# Patient Record
Sex: Female | Born: 1960
Health system: Southern US, Community
[De-identification: ages and names within clinical notes are randomized; demographics above are authoritative.]

## PROBLEM LIST (undated history)

## (undated) DIAGNOSIS — Z86010 Personal history of colonic polyps: Secondary | ICD-10-CM

## (undated) DIAGNOSIS — M19041 Primary osteoarthritis, right hand: Secondary | ICD-10-CM

## (undated) DIAGNOSIS — R112 Nausea with vomiting, unspecified: Secondary | ICD-10-CM

## (undated) DIAGNOSIS — H269 Unspecified cataract: Secondary | ICD-10-CM

## (undated) DIAGNOSIS — J45909 Unspecified asthma, uncomplicated: Secondary | ICD-10-CM

## (undated) DIAGNOSIS — T7840XA Allergy, unspecified, initial encounter: Secondary | ICD-10-CM

## (undated) DIAGNOSIS — G5603 Carpal tunnel syndrome, bilateral upper limbs: Secondary | ICD-10-CM

## (undated) DIAGNOSIS — Z9889 Other specified postprocedural states: Secondary | ICD-10-CM

## (undated) DIAGNOSIS — J302 Other seasonal allergic rhinitis: Secondary | ICD-10-CM

## (undated) DIAGNOSIS — M19042 Primary osteoarthritis, left hand: Secondary | ICD-10-CM

## (undated) HISTORY — DX: Unspecified asthma, uncomplicated: J45.909

## (undated) HISTORY — DX: Allergy, unspecified, initial encounter: T78.40XA

## (undated) HISTORY — PX: BREAST EXCISIONAL BIOPSY: SUR124

## (undated) HISTORY — DX: Personal history of colonic polyps: Z86.010

## (undated) HISTORY — DX: Unspecified cataract: H26.9

## (undated) HISTORY — DX: Other seasonal allergic rhinitis: J30.2

## (undated) HISTORY — PX: OTHER SURGICAL HISTORY: SHX169

## (undated) HISTORY — PX: CATARACT EXTRACTION: SUR2

## (undated) HISTORY — DX: Primary osteoarthritis, right hand: M19.041

## (undated) HISTORY — DX: Primary osteoarthritis, right hand: M19.042

## (undated) HISTORY — PX: WISDOM TOOTH EXTRACTION: SHX21

## (undated) HISTORY — DX: Carpal tunnel syndrome, bilateral upper limbs: G56.03

## (undated) HISTORY — PX: CHOLECYSTECTOMY: SHX55

---

## 1999-02-06 ENCOUNTER — Ambulatory Visit (HOSPITAL_BASED_OUTPATIENT_CLINIC_OR_DEPARTMENT_OTHER): Admission: RE | Admit: 1999-02-06 | Discharge: 1999-02-06 | Payer: Self-pay | Admitting: Orthopedic Surgery

## 1999-06-12 ENCOUNTER — Other Ambulatory Visit: Admission: RE | Admit: 1999-06-12 | Discharge: 1999-06-12 | Payer: Self-pay | Admitting: Gynecology

## 2000-06-20 ENCOUNTER — Other Ambulatory Visit: Admission: RE | Admit: 2000-06-20 | Discharge: 2000-06-20 | Payer: Self-pay | Admitting: Gynecology

## 2001-07-28 ENCOUNTER — Other Ambulatory Visit: Admission: RE | Admit: 2001-07-28 | Discharge: 2001-07-28 | Payer: Self-pay | Admitting: Gynecology

## 2001-07-31 ENCOUNTER — Encounter: Admission: RE | Admit: 2001-07-31 | Discharge: 2001-09-05 | Payer: Self-pay | Admitting: Family Medicine

## 2001-12-25 ENCOUNTER — Encounter: Payer: Self-pay | Admitting: Family Medicine

## 2001-12-25 ENCOUNTER — Encounter: Admission: RE | Admit: 2001-12-25 | Discharge: 2001-12-25 | Payer: Self-pay | Admitting: Family Medicine

## 2002-05-25 ENCOUNTER — Encounter (INDEPENDENT_AMBULATORY_CARE_PROVIDER_SITE_OTHER): Payer: Self-pay | Admitting: Specialist

## 2002-05-25 ENCOUNTER — Observation Stay (HOSPITAL_COMMUNITY): Admission: RE | Admit: 2002-05-25 | Discharge: 2002-05-26 | Payer: Self-pay | Admitting: Surgery

## 2002-08-10 ENCOUNTER — Other Ambulatory Visit: Admission: RE | Admit: 2002-08-10 | Discharge: 2002-08-10 | Payer: Self-pay | Admitting: Gynecology

## 2003-08-06 ENCOUNTER — Encounter: Admission: RE | Admit: 2003-08-06 | Discharge: 2003-08-06 | Payer: Self-pay | Admitting: Gynecology

## 2003-08-10 ENCOUNTER — Other Ambulatory Visit: Admission: RE | Admit: 2003-08-10 | Discharge: 2003-08-10 | Payer: Self-pay | Admitting: Gynecology

## 2004-08-10 ENCOUNTER — Other Ambulatory Visit: Admission: RE | Admit: 2004-08-10 | Discharge: 2004-08-10 | Payer: Self-pay | Admitting: Gynecology

## 2004-09-05 ENCOUNTER — Encounter: Admission: RE | Admit: 2004-09-05 | Discharge: 2004-09-05 | Payer: Self-pay | Admitting: Gynecology

## 2004-09-29 ENCOUNTER — Ambulatory Visit (HOSPITAL_COMMUNITY): Admission: RE | Admit: 2004-09-29 | Discharge: 2004-09-29 | Payer: Self-pay | Admitting: Gynecology

## 2005-06-22 ENCOUNTER — Ambulatory Visit: Payer: Self-pay | Admitting: Internal Medicine

## 2005-08-03 ENCOUNTER — Ambulatory Visit: Payer: Self-pay | Admitting: Internal Medicine

## 2005-08-14 ENCOUNTER — Other Ambulatory Visit: Admission: RE | Admit: 2005-08-14 | Discharge: 2005-08-14 | Payer: Self-pay | Admitting: Gynecology

## 2005-09-06 ENCOUNTER — Encounter: Admission: RE | Admit: 2005-09-06 | Discharge: 2005-09-06 | Payer: Self-pay | Admitting: Gynecology

## 2005-10-05 ENCOUNTER — Ambulatory Visit: Payer: Self-pay | Admitting: Internal Medicine

## 2005-10-24 ENCOUNTER — Ambulatory Visit: Payer: Self-pay | Admitting: Internal Medicine

## 2005-12-21 ENCOUNTER — Ambulatory Visit: Payer: Self-pay | Admitting: Internal Medicine

## 2006-02-21 ENCOUNTER — Other Ambulatory Visit: Admission: RE | Admit: 2006-02-21 | Discharge: 2006-02-21 | Payer: Self-pay | Admitting: Gynecology

## 2006-02-28 ENCOUNTER — Ambulatory Visit: Payer: Self-pay | Admitting: Internal Medicine

## 2006-05-23 ENCOUNTER — Ambulatory Visit: Payer: Self-pay | Admitting: Internal Medicine

## 2006-05-29 ENCOUNTER — Ambulatory Visit: Payer: Self-pay | Admitting: Internal Medicine

## 2006-08-20 ENCOUNTER — Other Ambulatory Visit: Admission: RE | Admit: 2006-08-20 | Discharge: 2006-08-20 | Payer: Self-pay | Admitting: Gynecology

## 2006-09-10 ENCOUNTER — Encounter: Admission: RE | Admit: 2006-09-10 | Discharge: 2006-09-10 | Payer: Self-pay | Admitting: Gynecology

## 2006-10-28 ENCOUNTER — Ambulatory Visit: Payer: Self-pay | Admitting: Internal Medicine

## 2007-01-10 DIAGNOSIS — J9801 Acute bronchospasm: Secondary | ICD-10-CM | POA: Insufficient documentation

## 2007-01-10 DIAGNOSIS — N6009 Solitary cyst of unspecified breast: Secondary | ICD-10-CM | POA: Insufficient documentation

## 2007-02-28 ENCOUNTER — Ambulatory Visit: Payer: Self-pay | Admitting: Internal Medicine

## 2007-07-15 ENCOUNTER — Ambulatory Visit (HOSPITAL_COMMUNITY): Admission: RE | Admit: 2007-07-15 | Discharge: 2007-07-15 | Payer: Self-pay | Admitting: Gynecology

## 2007-09-04 ENCOUNTER — Other Ambulatory Visit: Admission: RE | Admit: 2007-09-04 | Discharge: 2007-09-04 | Payer: Self-pay | Admitting: Gynecology

## 2007-09-16 ENCOUNTER — Encounter: Admission: RE | Admit: 2007-09-16 | Discharge: 2007-09-16 | Payer: Self-pay | Admitting: Gynecology

## 2008-09-06 ENCOUNTER — Other Ambulatory Visit: Admission: RE | Admit: 2008-09-06 | Discharge: 2008-09-06 | Payer: Self-pay | Admitting: Gynecology

## 2008-10-13 ENCOUNTER — Encounter: Admission: RE | Admit: 2008-10-13 | Discharge: 2008-10-13 | Payer: Self-pay | Admitting: Gynecology

## 2009-03-02 ENCOUNTER — Ambulatory Visit: Admission: RE | Admit: 2009-03-02 | Discharge: 2009-03-02 | Payer: Self-pay | Admitting: Gynecologic Oncology

## 2009-08-05 ENCOUNTER — Ambulatory Visit: Payer: Self-pay | Admitting: Family

## 2009-08-05 DIAGNOSIS — J209 Acute bronchitis, unspecified: Secondary | ICD-10-CM | POA: Insufficient documentation

## 2009-08-05 DIAGNOSIS — M1990 Unspecified osteoarthritis, unspecified site: Secondary | ICD-10-CM | POA: Insufficient documentation

## 2009-09-07 ENCOUNTER — Ambulatory Visit: Payer: Self-pay | Admitting: Radiology

## 2009-09-07 ENCOUNTER — Emergency Department (HOSPITAL_BASED_OUTPATIENT_CLINIC_OR_DEPARTMENT_OTHER): Admission: EM | Admit: 2009-09-07 | Discharge: 2009-09-07 | Payer: Self-pay | Admitting: Emergency Medicine

## 2009-09-14 ENCOUNTER — Ambulatory Visit: Payer: Self-pay | Admitting: Family

## 2009-09-14 DIAGNOSIS — S20219A Contusion of unspecified front wall of thorax, initial encounter: Secondary | ICD-10-CM | POA: Insufficient documentation

## 2010-10-09 ENCOUNTER — Encounter: Payer: Self-pay | Admitting: Internal Medicine

## 2010-11-02 NOTE — Letter (Signed)
Summary: Colonoscopy Letter   Gastroenterology  [address]   [place], [place] [address]   Phone: [phone]  Fax: [phone]      October 09, 2010 MRN: [id number]   [facility] [address] [place], [place]  [address]   Dear Paula Leblanc,   According to your medical record, it is time for you to schedule a Colonoscopy. The American Cancer Society recommends this procedure as a method to detect early colon cancer. Patients with a family history of colon cancer, or a personal history of colon polyps or inflammatory bowel disease are at increased risk.  This letter has been generated based on the recommendations made at the time of your procedure. If you feel that in your particular situation this may no longer apply, please contact our office.  Please call our office at [phone] to schedule this appointment or to update your records at your earliest convenience.  Thank you for cooperating with [place] to provide you with the very best care possible.   Sincerely,   Stan Head, M.D.  [facility] Gastroenterology Division [phone]

## 2010-11-23 ENCOUNTER — Encounter (INDEPENDENT_AMBULATORY_CARE_PROVIDER_SITE_OTHER): Payer: Self-pay | Admitting: *Deleted

## 2010-11-28 ENCOUNTER — Encounter (INDEPENDENT_AMBULATORY_CARE_PROVIDER_SITE_OTHER): Payer: Self-pay | Admitting: *Deleted

## 2010-11-28 NOTE — Letter (Signed)
Summary: Pre Visit Letter Revised  [facility]  [address] [place], [place] [address]   Phone: [phone]  Fax: [phone]        11/23/2010 MRN: [id number] Paula Leblanc [address] [place], [place]  [address]             Procedure Date:  12/07/2010 @ 8:30   Recall colon-Dr. Leone Payor   Welcome to the Gastroenterology Division at [facility].    You are scheduled to see a nurse for your pre-procedure visit on 11/29/2010 at 2:30 on the 3rd floor at [facility], [address]  We ask that you try to arrive at our office 15 minutes prior to your appointment time to allow for check-in.  Please take a minute to review the attached form.  If you answer "Yes" to one or more of the questions on the first page, we ask that you call the person listed at your earliest opportunity.  If you answer "No" to all of the questions, please complete the rest of the form and bring it to your appointment.    Your nurse visit will consist of discussing your medical and surgical history, your immediate family medical history, and your medications.   If you are unable to list all of your medications on the form, please bring the medication bottles to your appointment and we will list them.  We will need to be aware of both prescribed and over the counter drugs.  We will need to know exact dosage information as well.    Please be prepared to read and sign documents such as consent forms, a financial agreement, and acknowledgement forms.  If necessary, and with your consent, a friend or relative is welcome to sit-in on the nurse visit with you.  Please bring your insurance card so that we may make a copy of it.  If your insurance requires a referral to see a specialist, please bring your referral form from your primary care physician.  No co-pay is required for this nurse visit.     If you cannot keep your appointment, please call [phone] to cancel or reschedule prior to your  appointment date.  This allows [place] the opportunity to schedule an appointment for another patient in need of care.    Thank you for choosing [facility] for your medical needs.  We appreciate the opportunity to care for you.  Please visit [place] at our website  to learn more about our practice.  Sincerely, The Gastroenterology Division

## 2010-11-29 ENCOUNTER — Encounter: Payer: Self-pay | Admitting: Internal Medicine

## 2010-12-07 ENCOUNTER — Other Ambulatory Visit (AMBULATORY_SURGERY_CENTER): Payer: 59 | Admitting: Internal Medicine

## 2010-12-07 ENCOUNTER — Encounter: Payer: Self-pay | Admitting: Internal Medicine

## 2010-12-07 DIAGNOSIS — K5730 Diverticulosis of large intestine without perforation or abscess without bleeding: Secondary | ICD-10-CM

## 2010-12-07 DIAGNOSIS — Z1211 Encounter for screening for malignant neoplasm of colon: Secondary | ICD-10-CM

## 2010-12-07 DIAGNOSIS — Z800 Family history of malignant neoplasm of digestive organs: Secondary | ICD-10-CM

## 2010-12-07 DIAGNOSIS — K648 Other hemorrhoids: Secondary | ICD-10-CM

## 2010-12-07 LAB — HM COLONOSCOPY

## 2010-12-07 NOTE — Miscellaneous (Signed)
Summary: [facility] Previsit/prep  Clinical Lists Changes  Medications: Added new medication of MOVIPREP 100 GM  SOLR (PEG-KCL-NACL-NASULF-NA ASC-C) As per prep instructions. - Signed Rx of MOVIPREP 100 GM  SOLR (PEG-KCL-NACL-NASULF-NA ASC-C) As per prep instructions.;  #1 x 0;  Signed;  Entered by: Wyona Almas RN;  Authorized by: Iva Boop MD, [facility];  Method used: [facility] [phone]*, [address] [place], [place], [place]  [address], Ph: [phone], Fax: [phone] Allergies: Changed allergy or adverse reaction from ERYTHROMYCIN to ERYTHROMYCIN Observations: Added new observation of ALLERGY REV: Done (11/29/2010 14:01)    Prescriptions: MOVIPREP 100 GM  SOLR (PEG-KCL-NACL-NASULF-NA ASC-C) As per prep instructions.  #1 x 0   Entered by:   Wyona Almas RN   Authorized by:   Iva Boop MD, [facility]   Signed by:   Wyona Almas RN on 11/29/2010   Method used:   [facility]ronically to        [facility] [phone]* (retail)       [address]       [place], [place]  [address]       Ph: [phone]       Fax: [phone]   RxID:   [id number]

## 2010-12-07 NOTE — Letter (Signed)
Summary: [facility] Instructions  [facility]  [address] [place], [place] [address]   Phone: [phone]  Fax: [phone]       Paula Leblanc    02-07-1961    MRN: [id number]        Procedure Day Dorna Bloom:  Lenor Coffin  12/07/10     Arrival Time:  7:30AM     Procedure Time:  8:30AM     Location of Procedure:                    _ X_  [facility] (4th Floor)                      PREPARATION FOR COLONOSCOPY WITH MOVIPREP   Starting 5 days prior to your procedure 12/02/10 do not eat nuts, seeds, popcorn, corn, beans, peas,  salads, or any raw vegetables.  Do not take any fiber supplements (e.g. Metamucil, Citrucel, and Benefiber).  THE DAY BEFORE YOUR PROCEDURE         DATE: 12/06/10  DAY: WEDNESDAY  1.  Drink clear liquids the entire day-NO SOLID FOOD  2.  Do not drink anything colored red or purple.  Avoid juices with pulp.  No orange juice.  3.  Drink at least 64 oz. (8 glasses) of fluid/clear liquids during the day to prevent dehydration and help the prep work efficiently.  CLEAR LIQUIDS INCLUDE: Water Jello Ice Popsicles Tea (sugar ok, no milk/cream) Powdered fruit flavored drinks Coffee (sugar ok, no milk/cream) Gatorade Juice: apple, white grape, white cranberry  Lemonade Clear bullion, consomm, broth Carbonated beverages (any kind) Strained chicken noodle soup Hard Candy                             4.  In the morning, mix first dose of MoviPrep solution:    Empty 1 Pouch A and 1 Pouch B into the disposable container    Add lukewarm drinking water to the top line of the container. Mix to dissolve    Refrigerate (mixed solution should be used within 24 hrs)  5.  Begin drinking the prep at 5:00 p.m. The MoviPrep container is divided by 4 marks.   Every 15 minutes drink the solution down to the next mark (approximately 8 oz) until the full liter is complete.   6.  Follow completed prep with 16 oz of clear liquid of your choice (Nothing  red or purple).  Continue to drink clear liquids until bedtime.  7.  Before going to bed, mix second dose of MoviPrep solution:    Empty 1 Pouch A and 1 Pouch B into the disposable container    Add lukewarm drinking water to the top line of the container. Mix to dissolve    Refrigerate  THE DAY OF YOUR PROCEDURE      DATE: 12/07/10   DAY: THURSDAY  Beginning at 3:30AM (5 hours before procedure):         1. Every 15 minutes, drink the solution down to the next mark (approx 8 oz) until the full liter is complete.  2. Follow completed prep with 16 oz. of clear liquid of your choice.    3. You may drink clear liquids until 6:30AM (2 HOURS BEFORE PROCEDURE).   MEDICATION INSTRUCTIONS  Unless otherwise instructed, you should take regular prescription medications with a small sip of water   as early as possible the morning of  your procedure.        OTHER INSTRUCTIONS  You will need a responsible adult at least 52 years of age to accompany you and drive you home.   This person must remain in the waiting room during your procedure.  Wear loose fitting clothing that is easily removed.  Leave jewelry and other valuables at home.  However, you may wish to bring a book to read or  an iPod/MP3 player to listen to music as you wait for your procedure to start.  Remove all body piercing jewelry and leave at home.  Total time from sign-in until discharge is approximately 2-3 hours.  You should go home directly after your procedure and rest.  You can resume normal activities the  day after your procedure.  The day of your procedure you should not:   Drive   Make legal decisions   Operate machinery   Drink alcohol   Return to work  You will receive specific instructions about eating, activities and medications before you leave.    The above instructions have been reviewed and explained to me by  Wyona Almas RN  November 29, 2010 3:02 PM     I fully understand and can  verbalize these instructions _____________________________ Date _________

## 2010-12-12 NOTE — Procedures (Signed)
Summary: Colonoscopy  Patient: Paula Leblanc Note: All result statuses are Final unless otherwise noted.  Tests: (1) Colonoscopy (COL)   COL Colonoscopy           DONE     [facility]     [address]     [place], [place]  [address]          COLONOSCOPY PROCEDURE REPORT          PATIENT:  Paula, Leblanc  MR#:  [id number]     BIRTHDATE:  26-Jan-1961, 50 yrs. old  GENDER:  female     ENDOSCOPIST:  Iva Boop, MD, [facility]          PROCEDURE DATE:  12/07/2010     PROCEDURE:  Colonoscopy [address]     ASA CLASS:  Class I     INDICATIONS:  Elevated Risk Screening, family history of colon     cancer father in 33's     sister also with hx polyps     MEDICATIONS:   Fentanyl 50 mcg IV, Versed 8 mg IV          DESCRIPTION OF PROCEDURE:   After the risks benefits and     alternatives of the procedure were thoroughly explained, informed     consent was obtained.  Digital rectal exam was performed and     revealed no abnormalities.   The LB 180AL [id number] endoscope was     introduced through the anus and advanced to the cecum, which was     identified by both the appendix and ileocecal valve, without     limitations.  The quality of the prep was good, using MoviPrep.     The instrument was then slowly withdrawn as the colon was fully     examined. Insertion: 5:39 minutes Withdrawal: 16:20 minutes     <<PROCEDUREIMAGES>>          FINDINGS:  Moderate diverticulosis was found in the sigmoid colon.     This was otherwise a normal examination of the colon. Includes     right retroflexion.   Retroflexed views in the rectum revealed     internal hemorrhoids.    The scope was then withdrawn from the     patient and the procedure completed.          COMPLICATIONS:  None     ENDOSCOPIC IMPRESSION:     1) Moderate diverticulosis in the sigmoid colon     2) Internal hemorrhoids     3) Otherwise normal examination, good prep          REPEAT EXAM:  In 5 year(s) for routine screening  colonoscopy.     Family history of colon cancer in father.          Iva Boop, MD, [facility]          CC:  The Patient          n.     eSIGNED:   Iva Boop at 12/07/2010 09:12 AM          Lenise Arena, [id number]  Note: An exclamation mark (!) indicates a result that was not dispersed into the flowsheet. Document Creation Date: 12/07/2010 9:17 AM _______________________________________________________________________  (1) Order result status: Final Collection or observation date-time: 12/07/2010 09:03 Requested date-time:  Receipt date-time:  Reported date-time:  Referring Physician:   Ordering Physician: Stan Head [phone]) Specimen Source:  Source: Launa Grill Order Number: [id number] Lab site:   Appended  Document: Colonoscopy    Clinical Lists Changes  Observations: Added new observation of COLONNXTDUE: 11/2015 (12/07/2010 14:06)

## 2011-01-02 LAB — BASIC METABOLIC PANEL WITH GFR
Calcium: 9.2 mg/dL (ref 8.4–10.5)
GFR calc Af Amer: >60 mL/min (ref >60)
GFR calc non Af Amer: >60 mL/min (ref >60)
Glucose, Bld: 99 mg/dL (ref 70–99)
Potassium: 4.4 meq/L (ref 3.5–5.1)
Sodium: 147 meq/L — ABNORMAL HIGH (ref 135–145)

## 2011-01-02 LAB — PROTIME-INR
INR: 0.99 (ref 0.00–1.49)
Prothrombin Time: 13 seconds (ref 11.6–15.2)

## 2011-01-02 LAB — DIFFERENTIAL
Basophils Absolute: 0.1 K/uL (ref 0.0–0.1)
Basophils Relative: 1 % (ref 0–1)
Eosinophils Absolute: 0.2 K/uL (ref 0.0–0.7)
Eosinophils Relative: 2 % (ref 0–5)
Lymphocytes Relative: 24 % (ref 12–46)
Lymphs Abs: 2.1 10*3/uL (ref 0.7–4.0)
Monocytes Absolute: 0.5 K/uL (ref 0.1–1.0)
Monocytes Relative: 6 % (ref 3–12)
Neutro Abs: 6.2 K/uL (ref 1.7–7.7)
Neutrophils Relative %: 67 % (ref 43–77)

## 2011-01-02 LAB — CBC
HCT: 42 % (ref 36.0–46.0)
Hemoglobin: 14.1 g/dL (ref 12.0–15.0)
MCHC: 33.6 g/dL (ref 30.0–36.0)
MCV: 90.1 fL (ref 78.0–100.0)
Platelets: 259 10*3/uL (ref 150–400)
RBC: 4.66 MIL/uL (ref 3.87–5.11)
RDW: 12.3 % (ref 11.5–15.5)
WBC: 9.1 K/uL (ref 4.0–10.5)

## 2011-01-02 LAB — D-DIMER, QUANTITATIVE: D-Dimer, Quant: 0.3 ug{FEU}/mL (ref 0.00–0.48)

## 2011-01-02 LAB — BASIC METABOLIC PANEL
BUN: 14 mg/dL (ref 6–23)
CO2: 28 mEq/L (ref 19–32)
Chloride: 109 mEq/L (ref 96–112)
Creatinine, Ser: 0.6 mg/dL (ref 0.4–1.2)

## 2011-01-02 LAB — PREGNANCY, URINE: Preg Test, Ur: NEGATIVE

## 2011-01-02 LAB — APTT: aPTT: 30 s (ref 24–37)

## 2011-01-25 ENCOUNTER — Ambulatory Visit (INDEPENDENT_AMBULATORY_CARE_PROVIDER_SITE_OTHER): Payer: 59 | Admitting: Internal Medicine

## 2011-01-25 ENCOUNTER — Encounter: Payer: Self-pay | Admitting: Internal Medicine

## 2011-01-25 VITALS — BP 116/80 | HR 80 | Wt 190.0 lb

## 2011-01-25 DIAGNOSIS — R209 Unspecified disturbances of skin sensation: Secondary | ICD-10-CM

## 2011-01-25 DIAGNOSIS — R5381 Other malaise: Secondary | ICD-10-CM

## 2011-01-25 DIAGNOSIS — R5383 Other fatigue: Secondary | ICD-10-CM | POA: Insufficient documentation

## 2011-01-25 DIAGNOSIS — R202 Paresthesia of skin: Secondary | ICD-10-CM | POA: Insufficient documentation

## 2011-01-25 DIAGNOSIS — Z136 Encounter for screening for cardiovascular disorders: Secondary | ICD-10-CM

## 2011-01-25 LAB — HEPATIC FUNCTION PANEL
AST: 20 U/L (ref 0–37)
Alkaline Phosphatase: 56 U/L (ref 39–117)
Bilirubin, Direct: 0 mg/dL (ref 0.0–0.3)

## 2011-01-25 LAB — CBC WITH DIFFERENTIAL/PLATELET
Basophils Relative: 0.8 % (ref 0.0–3.0)
HCT: 41.5 % (ref 36.0–46.0)
Lymphocytes Relative: 29.3 % (ref 12.0–46.0)
Lymphs Abs: 2.1 10*3/uL (ref 0.7–4.0)
Monocytes Relative: 5.7 % (ref 3.0–12.0)
WBC: 7.3 10*3/uL (ref 4.5–10.5)

## 2011-01-25 LAB — BASIC METABOLIC PANEL
BUN: 15 mg/dL (ref 6–23)
CO2: 33 mEq/L — ABNORMAL HIGH (ref 19–32)
Calcium: 9.2 mg/dL (ref 8.4–10.5)
Creatinine, Ser: 0.6 mg/dL (ref 0.4–1.2)
GFR: 103.5 mL/min (ref >60.00)

## 2011-01-25 LAB — VITAMIN B12: Vitamin B-12: 563 pg/mL (ref 211–911)

## 2011-01-25 LAB — TSH: TSH: 1.63 u[IU]/mL (ref 0.35–5.50)

## 2011-01-25 NOTE — Assessment & Plan Note (Signed)
Right arm paresthesias for one week, neurological exam normal, no associated symptoms. Labs Recommend to let me know if symptoms increase or if she has associated problems such as slurred speech et Karie Soda.

## 2011-01-25 NOTE — Progress Notes (Signed)
  Subjective:    Patient ID: Paula Leblanc, female    DOB: 10-16-1960, 50 y.o.   MRN: [id number]  HPI Here with a 4 month history of fatigue described as a generalized lack of energy. Not particularly sleepy She recognizes that her work load at the office has increased and she is under more stress.  Also for the last week she had several episodes of right arm tingling, they last about a minute, they are not associated with double vision, face weakness or slurred speech. She admits to a mild neck pain for a few months  .No past medical history on file.  Past Surgical History  Procedure Date  . Cholecystectomy    History   Social History  . Marital Status: Married    Spouse Name: N/A    Number of Children: 1  . Years of Education: N/A   Occupational History  . office manager, law firm    Social History Main Topics  . Smoking status: Never Smoker   . Smokeless tobacco: Not on file  . Alcohol Use: No  . Drug Use: No  . Sexually Active: Not on file   Other Topics Concern  . Not on file   Social History Narrative   Married w/ Paula Leblanc, daughter in law of Paula Leblanc....      Review of Systems No fevers, no joint aches except for the left hand where she knows she has DJD. headaches from time to time but only related to her cycle. Her periods have been irregular in the last few months, denies hot flashes. No mood swings. Denies anxiety and depression per se but thinks that her sleep quality has definitely decreased. She knows that she snores heavily for a long time, that is no worse than usual. No substernal chest pain, occasionally she has sharp chest pain in the left upper chest that lasts few seconds. No dyspnea on exertion or lower extremity edema. No nausea, vomiting, diarrhea or blood in the stools. Recent colonoscopy 11-2010 short diverticulitis only.    Objective:   Physical Exam  Constitutional: She is oriented to person, place, and time. She appears  well-developed and well-nourished. No distress.  HENT:  Head: Normocephalic and atraumatic.  Neck: No JVD present. No thyromegaly present.       Normal carotid pulses  Cardiovascular: Normal rate, regular rhythm and normal heart sounds.   No murmur heard. Pulmonary/Chest: Effort normal and breath sounds normal. No respiratory distress. She has no wheezes. She has no rales.  Abdominal: Soft. Bowel sounds are normal. She exhibits no distension. There is no tenderness. There is no rebound and no guarding.  Musculoskeletal: She exhibits no edema.  Neurological: She is alert and oriented to person, place, and time. She displays normal reflexes. No cranial nerve deficit. She exhibits normal muscle tone. Coordination normal.       Face symetric, speech normal  Skin: She is not diaphoretic.  Psychiatric: She has a normal mood and affect. Her behavior is normal. Thought content normal.          Assessment & Plan:

## 2011-01-25 NOTE — Assessment & Plan Note (Addendum)
4 month history of generalized fatigue, review of systems discloses increased in work load, and decreased quality of the sleep. She has a long history of snoring. Symptoms may be related to above. Plan: Labs Encouraged more exercise Reassess in 3 months If not better, consider sleep study, she has RF for sleep apnea such as her weight and a short neck. Declined sleep meds , take melatonin OTC

## 2011-01-26 LAB — VITAMIN D 25 HYDROXY (VIT D DEFICIENCY, FRACTURES): Vit D, 25-Hydroxy: 40 ng/mL (ref 30–89)

## 2011-01-29 ENCOUNTER — Telehealth: Payer: Self-pay | Admitting: *Deleted

## 2011-01-29 NOTE — Telephone Encounter (Signed)
Message copied by Army Fossa on Mon Jan 29, 2011 10:43 AM ------      Message from: Willow Ora      Created: Sun Jan 28, 2011 10:03 AM       Advise patient:      Vitamins normal      All other labs ok , just the sodium is slt elevated (needs to keep good hydration)      Good results

## 2011-01-29 NOTE — Telephone Encounter (Signed)
Pt is aware.  

## 2011-01-29 NOTE — Telephone Encounter (Signed)
Message left for patient to return my call.  

## 2011-02-13 NOTE — Consult Note (Signed)
Paula, Leblanc                ACCOUNT NO.:  [id number]   MEDICAL RECORD NO.:  [id number]          PATIENT TYPE:  OUT   LOCATION:  GYN                          FACILITY:  [facility]   PHYSICIAN:  John T. Kyla Balzarine, M.D.    DATE OF BIRTH:  [birth year]   DATE OF CONSULTATION:  03/02/2009  DATE OF DISCHARGE:                                 CONSULTATION   CHIEF COMPLAINT:  This 50 year old para 1 is seen at the request of Dr.  Chevis Pretty because of a rising CA-125 value with longstanding probable  bilateral hydrosalpinx.   HISTORY:  The patient's records, history from the patient and discussion  with Dr. Chevis Pretty complete the history.  This patient has had a history of  hydrosalpinx as a likely ultrasound diagnosis for more than 2 years.  Per the patient's recollection, she may have had a CA-125 value done  initially that was normal.  In January 2010, ultrasound again revealed  bilateral tubal cystic structures adjacent to normal ovaries,  measuring 7.1 x 2.2 x 4.1 cm on the left and 4.0 x 1.4 x 2.1 cm on the  right.  There was no free fluid and there were some intramural fibroids.  CA-125 value at that time was 36.  The patient since has been  amenorrheic with hot flashes.  A follow up ultrasound in April 2010 was  essentially without change and CA-125 value was 43.4.  The patient does  note some arthritis affecting her distal joints, but has no other major  medical comorbidities.  Of note, she had infertility that was evaluated  in the early 1990s in [place] with laparoscopy.  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  Records of this surgery are not available,  but the patient cannot recall being told whether she had endometriosis.  No history of PID.  She conceived via IVF.   PAST MEDICAL HISTORY:  1. Degenerative arthritis.  2. Bilateral cataract extraction.  3. Laparoscopic cholecystectomy.  4. Prior NSVD and gynecological laparoscopy as noted.   MEDICATIONS:  1. Januvia.  2.  Calcium.  3. Multivitamins.   ALLERGIES:  ERYTHROMYCIN.   FAMILY HISTORY:  Significant for father with colon cancer, but no known  ovarian, breast or uterine malignancies.   PERSONAL/SOCIAL HISTORY:  Married.  Denies tobacco, admits to social  ethanol.   REVIEW OF SYSTEMS:  The patient has multiple environmental allergies  that produce bronchospasm.  She was diagnosed as asthmatic in the  past, but has never been hospitalized and has never taken chronic  steroids or inhalation therapy.  Otherwise, she is entirely asymptomatic  with the exception of an occasional pulling discomfort in the right  lower quadrant.   PHYSICAL EXAMINATION:  VITAL SIGNS:  Stable and afebrile with weight 178  pounds.  GENERAL:  The patient is anxious, alert and oriented x3 in no acute  distress.  NECK:  Supple without goiter.  Oropharynx without erythema.  LUNGS:  Clear with no wheezing.  HEART:  Regular rate and rhythm with no JVD; intact peripheral pulses.  BACK:  No spinous or CVA tenderness.  LYMPH SURVEY:  Negative for pathologic lymphadenopathy.  ABDOMEN:  Soft and benign with well-healed trocar sites.  No hernia,  ascites, mass or tenderness.  EXTREMITIES:  Full strength and range of motion without edema, cords or  Homan's.  SKIN:  No suspicious lesions.  NEUROLOGIC:  Screen intact.  PELVIC:  External genitalia, BUS, bladder and urethra are normal.  The  vagina and cervix are clear.  There is no cervical motion tenderness.  On bimanual and rectovaginal examinations, mild fullness and tenderness  in the right adnexa without distinctly palpable mass.  Mild fullness in  the left adnexa without distinct mass or tenderness.  No cul-de-sac  nodularity.   ASSESSMENT:  Likely hydrosalpinx with low-grade CA-125 elevation.   PLAN:  The patient was discussed with Dr. Chevis Pretty and recommended that a  repeat CA-125 value be obtained in the latter half of this month.  If  significantly elevated (more than  10.7), she would require surgery.  If  stable, I would repeat a value in approximately 3 months.  If her CA-125  has normalized, I would repeat it annually.  This recommendation was  discussed with the patient including potential causes of CA-125  elevation.  We would be glad to see the patient back on a p.r.n. basis  depending on the results of her subsequent CA-125 assay.      John T. Kyla Balzarine, M.D.  Electronically Signed     JTS/MEDQ  D:  03/02/2009  T:  03/02/2009  Job:  [id number]   cc:   Leatha Gilding. Mezer, M.D.  Fax: [phone]   Telford Nab, R.N.  [address]  [place], [place] [address]   MD Kathy Breach

## 2011-02-16 NOTE — Op Note (Signed)
TNAMEMEDRITH, VEILLON A                         ACCOUNT NO.:  [id number]   MEDICAL RECORD NO.:  [id number]                   PATIENT TYPE:  AMB   LOCATION:  DAY                                  [facility]ACILITY[facility]   PHYSICIAN:  Douglas A. Magnus Ivan, M.D.           DATE OF BIRTH:  [birth year]   DATE OF PROCEDURE:  05/25/2002  DATE OF DISCHARGE:                                 OPERATIVE REPORT   PREOPERATIVE DIAGNOSIS:  Symptomatic cholelithiasis.   POSTOPERATIVE DIAGNOSIS:  Symptomatic cholelithiasis.   PROCEDURE:  Laparoscopic cholecystectomy.   SURGEON:  Douglas A. Magnus Ivan, M.D.   ANESTHESIA:  General endotracheal anesthesia and 0.25% Marcaine.   ESTIMATED BLOOD LOSS:  Minimal.   DESCRIPTION OF PROCEDURE:  The patient was brought to the operating room,  identified as Paula Leblanc. She was placed supine on the operating room  table and general anesthesia was induced. Her abdomen was then prepped and  draped in the usual sterile fashion. Using a #15 blade, a small transverse  incision was made below the umbilicus. The incision was carried down to the  fascia which was then opened with the scalpel. A hemostat was then used to  pass into the peritoneal cavity. A #0 Vicryl pursestring suture was then  passed around the fascial opening. The Hasson port was placed through the  opening and insufflation of the abdomen was begun. An 11 mm port was then  placed in the patient's epigastrium and two 5 mm ports were placed in the  patient's right flank under direct vision. The gallbladder was then  identified and retracted above the liver bed. Dissection was then carried  out at the base of the gallbladder. The cystic duct was easily dissected  out. It was then clipped three times proximally and once distally and  transected with the scissors. The cystic artery was then identified and  clipped twice proximally, one distally and transected as well. The  gallbladder was then easily dissected  free from the liver bed with the  electrocautery. The gallbladder appeared to have a chronically infected  wall. Once the gallbladder was completely free from the liver bed,  hemostasis was achieved in the bed with the cautery. The gallbladder was  then removed through the incision at the umbilicus. The #0 Vicryl suture at  the umbilicus was tied in place closing the fascial defect. The abdomen was  then copiously irrigated with normal saline. Again hemostasis appeared to be  achieved. All ports were then removed under direct vision and the abdomen  was deflated. All incisions were then anesthetized with 0.25% Marcaine and  closed with 4-0 Monocryl subcuticular sutures. Steri-Strips, gauze and tape  were then applied. The patient tolerated the procedure well. All sponge,  needle and instrument counts were correct at the end of the procedure. The  patient was then extubated in the operating room and taken in stable  condition to the recovery room.  Douglas A. Magnus Ivan, M.D.    DAB/MEDQ  D:  05/25/2002  T:  05/26/2002  Job:  [id number]

## 2011-04-05 ENCOUNTER — Other Ambulatory Visit: Payer: Self-pay | Admitting: Gynecology

## 2011-04-05 DIAGNOSIS — Z1231 Encounter for screening mammogram for malignant neoplasm of breast: Secondary | ICD-10-CM

## 2011-04-12 ENCOUNTER — Ambulatory Visit
Admission: RE | Admit: 2011-04-12 | Discharge: 2011-04-12 | Disposition: A | Discharge status: Home / Self Care | Payer: 59 | Source: Ambulatory Visit | Attending: Gynecology | Admitting: Gynecology

## 2011-04-12 DIAGNOSIS — Z1231 Encounter for screening mammogram for malignant neoplasm of breast: Secondary | ICD-10-CM

## 2011-04-25 ENCOUNTER — Other Ambulatory Visit: Payer: Self-pay | Admitting: Gynecology

## 2011-04-26 ENCOUNTER — Ambulatory Visit (INDEPENDENT_AMBULATORY_CARE_PROVIDER_SITE_OTHER): Payer: 59 | Admitting: Internal Medicine

## 2011-04-26 ENCOUNTER — Encounter: Payer: Self-pay | Admitting: Internal Medicine

## 2011-04-26 DIAGNOSIS — R5381 Other malaise: Secondary | ICD-10-CM

## 2011-04-26 DIAGNOSIS — R202 Paresthesia of skin: Secondary | ICD-10-CM

## 2011-04-26 DIAGNOSIS — Z0000 Encounter for general adult medical examination without abnormal findings: Secondary | ICD-10-CM

## 2011-04-26 DIAGNOSIS — G4700 Insomnia, unspecified: Secondary | ICD-10-CM

## 2011-04-26 DIAGNOSIS — R5383 Other fatigue: Secondary | ICD-10-CM

## 2011-04-26 DIAGNOSIS — R209 Unspecified disturbances of skin sensation: Secondary | ICD-10-CM

## 2011-04-26 LAB — LDL CHOLESTEROL, DIRECT: Direct LDL: 129.9 mg/dL

## 2011-04-26 LAB — LIPID PANEL
HDL: 47.1 mg/dL (ref >39.00)
Total CHOL/HDL Ratio: 4
Triglycerides: 178 mg/dL — ABNORMAL HIGH (ref 0.0–149.0)
VLDL: 35.6 mg/dL (ref 0.0–40.0)

## 2011-04-26 MED ORDER — ZOLPIDEM TARTRATE 10 MG PO TABS: 10.0000 mg | ORAL_TABLET | Freq: Every evening | ORAL | Status: DC | PRN

## 2011-04-26 NOTE — Assessment & Plan Note (Signed)
Inconsistent tingling between the shoulder and a elbow on the right arm. Neuro exam normal. We'll prescribe observation.

## 2011-04-26 NOTE — Assessment & Plan Note (Signed)
Agrees to treatment, options: xanax, ambien, klonopin, etc [facility] provided also healthy sleep habits discussed

## 2011-04-26 NOTE — Assessment & Plan Note (Addendum)
Saw gyn this week, had a u/s and a Bx done (cervical? Endometrial?), results pending Likes chol checked: done

## 2011-04-26 NOTE — Assessment & Plan Note (Signed)
Continue with mild fatigue, labs were  normal, I have the feeling that if she is able to sleep better she will feel better. She agrees. We'll start treatment for insomnia

## 2011-04-26 NOTE — Progress Notes (Signed)
  Subjective:    Patient ID: Paula Leblanc, female    DOB: 1961-01-26, 50 y.o.   MRN: [id number]  HPI Followup from previous visit, doing okay, would like her cholesterol checked.  No past medical history on file.  Past Surgical History  Procedure Date  . Cholecystectomy     Review of Systems Still fatigued from time to time, symptoms are not severe. Denies feeling sleepy throughout the day Still has a difficult time sleeping both falling asleep and staying asleep. Melatonin used to help better w/  Insomnia. She admits to some anxiety, workload at her job has increased and probably will keep increasing. Still has some tingling in the right arm between the shoulder and elbow, symptoms are inconsistent, on and off. No neck pain.       Objective:   Physical Exam  Constitutional: She is oriented to person, place, and time. She appears well-developed and well-nourished. No distress.  Cardiovascular: Normal rate, regular rhythm and normal heart sounds.   No murmur heard. Pulmonary/Chest: Effort normal and breath sounds normal. No respiratory distress. She has no wheezes. She has no rales.  Neurological: She is alert and oriented to person, place, and time. She displays normal reflexes. She exhibits normal muscle tone.  Skin: She is not diaphoretic.          Assessment & Plan:

## 2011-05-01 ENCOUNTER — Telehealth: Payer: Self-pay | Admitting: *Deleted

## 2011-05-01 NOTE — Telephone Encounter (Signed)
Message copied by Leanne Lovely on Tue May 01, 2011  9:15 AM ------      Message from: Paula Leblanc      Created: Mon Apr 30, 2011  9:41 PM       Advise patient:      Cholesterol ok , TG slt elevated at 178:  rec a healthy diet and exercise daily . Recheck yearly

## 2011-05-01 NOTE — Telephone Encounter (Signed)
Message left for patient to return my call.  

## 2011-05-01 NOTE — Telephone Encounter (Signed)
Spoke w/ pt aware of labs.  

## 2011-07-25 ENCOUNTER — Ambulatory Visit: Payer: 59

## 2011-07-26 ENCOUNTER — Ambulatory Visit (INDEPENDENT_AMBULATORY_CARE_PROVIDER_SITE_OTHER): Payer: 59

## 2011-07-26 DIAGNOSIS — Z23 Encounter for immunization: Secondary | ICD-10-CM

## 2011-09-17 ENCOUNTER — Other Ambulatory Visit: Payer: Self-pay | Admitting: Internal Medicine

## 2011-09-18 MED ORDER — ZOLPIDEM TARTRATE 10 MG PO TABS: 10.0000 mg | ORAL_TABLET | Freq: Every evening | ORAL | Status: DC | PRN

## 2011-09-18 NOTE — Telephone Encounter (Signed)
Rx faxed

## 2011-09-18 NOTE — Telephone Encounter (Signed)
04-26-11 Last OV, last refilled #[phone]

## 2011-09-18 NOTE — Telephone Encounter (Signed)
Ok 30 and 5 RF 

## 2011-12-14 ENCOUNTER — Encounter: Payer: Self-pay | Admitting: Internal Medicine

## 2011-12-14 ENCOUNTER — Ambulatory Visit (INDEPENDENT_AMBULATORY_CARE_PROVIDER_SITE_OTHER): Payer: 59 | Admitting: Internal Medicine

## 2011-12-14 DIAGNOSIS — R079 Chest pain, unspecified: Secondary | ICD-10-CM

## 2011-12-14 DIAGNOSIS — G4700 Insomnia, unspecified: Secondary | ICD-10-CM

## 2011-12-14 LAB — CBC WITH DIFFERENTIAL/PLATELET
Basophils Relative: 0.5 % (ref 0.0–3.0)
Eosinophils Absolute: 0.1 10*3/uL (ref 0.0–0.7)
Eosinophils Relative: 2.1 % (ref 0.0–5.0)
Lymphocytes Relative: 29.1 % (ref 12.0–46.0)
Neutrophils Relative %: 64.3 % (ref 43.0–77.0)
Platelets: 233 10*3/uL (ref 150.0–400.0)
RBC: 4.64 Mil/uL (ref 3.87–5.11)
WBC: 6.9 10*3/uL (ref 4.5–10.5)

## 2011-12-14 LAB — BASIC METABOLIC PANEL
Calcium: 9.1 mg/dL (ref 8.4–10.5)
Creatinine, Ser: 0.7 mg/dL (ref 0.4–1.2)

## 2011-12-14 MED ORDER — ZOLPIDEM TARTRATE 10 MG PO TABS: 10.0000 mg | ORAL_TABLET | Freq: Every evening | ORAL | Status: DC | PRN

## 2011-12-14 NOTE — Progress Notes (Signed)
  Subjective:    Patient ID: Grace Blight, female    DOB: [birth year], 51 y.o.   MRN: [id number]  HPI ROV Needs a RF on ambien Also c/o CP: happened yesterday, located at the L upper chest, some radiation to the L shoulder, not to the neck, lasted ~ 1 hour, no associated sx ; it happened in the setting of emotional distress.  PMH Insomnia   FH: Cad-- GM x 2, F at age 35  SH: No tobacco ETOH-- rarely Occupation-- Social worker firm (books)   Review of Systems No N-V, no SOB No palpitations, LE edema or recent airplane trip No GERD sx     Objective:   Physical Exam  Constitutional: She appears well-developed. No distress.  Cardiovascular: Normal rate, regular rhythm and intact distal pulses.   No murmur heard. Pulmonary/Chest: Effort normal and breath sounds normal. No respiratory distress. She has no wheezes. She has no rales. She exhibits no tenderness.  Musculoskeletal: She exhibits no edema.  Skin: She is not diaphoretic.          Assessment & Plan:  CP: 51 y/o female with a 1 hour h/o CP, CV RF are low but given age and gender I can't r/o heart dz on clinical grounds We agreed to order a stress test, needs to be done 10 days from today d/t scheduling issues. ER if sx recur. Labs

## 2011-12-14 NOTE — Assessment & Plan Note (Signed)
CP: 51 y/o female with a single  1 hour e[isode of CP, CV RF are low but given age and gender I can't r/o heart dz on clinical grounds We agreed to order a stress test, needs to be done 10 days from today d/t scheduling issues. ER if sx recur. Labs

## 2011-12-14 NOTE — Assessment & Plan Note (Signed)
RF meds  

## 2011-12-25 ENCOUNTER — Ambulatory Visit: Payer: 59 | Admitting: Internal Medicine

## 2011-12-25 ENCOUNTER — Ambulatory Visit (HOSPITAL_COMMUNITY): Payer: 59 | Attending: Cardiology | Admitting: Radiology

## 2011-12-25 DIAGNOSIS — R5381 Other malaise: Secondary | ICD-10-CM | POA: Insufficient documentation

## 2011-12-25 DIAGNOSIS — R5383 Other fatigue: Secondary | ICD-10-CM

## 2011-12-25 DIAGNOSIS — E119 Type 2 diabetes mellitus without complications: Secondary | ICD-10-CM | POA: Insufficient documentation

## 2011-12-25 DIAGNOSIS — Z8249 Family history of ischemic heart disease and other diseases of the circulatory system: Secondary | ICD-10-CM | POA: Insufficient documentation

## 2011-12-25 DIAGNOSIS — R079 Chest pain, unspecified: Secondary | ICD-10-CM | POA: Insufficient documentation

## 2011-12-25 DIAGNOSIS — E663 Overweight: Secondary | ICD-10-CM | POA: Insufficient documentation

## 2011-12-25 MED ORDER — TECHNETIUM TC 99M TETROFOSMIN IV KIT
11.0000 | PACK | Freq: Once | INTRAVENOUS | Status: AC | PRN
  Administered 2011-12-25: 11 via INTRAVENOUS

## 2011-12-25 MED ORDER — TECHNETIUM TC 99M TETROFOSMIN IV KIT
33.0000 | PACK | Freq: Once | INTRAVENOUS | Status: AC | PRN
  Administered 2011-12-25: 33 via INTRAVENOUS

## 2011-12-25 NOTE — Progress Notes (Signed)
[facility] SITE 3 NUCLEAR MED [address] [place] [address] [phone]  Cardiology Nuclear Med Study  Paula Leblanc is a 51 y.o. female     MRN : [id number]     DOB: [birth year]  Procedure Date: 12/25/2011  Nuclear Med Background Indication for Stress Test:  Evaluation for Ischemia History:  No documented CAD Cardiac Risk Factors: Family History - CAD, NIDDM and Overweight  Symptoms:  Chest Pain>(L) Shoulder (last episode of chest discomfort was on 12/14/11) and Fatigue   Nuclear Pre-Procedure Caffeine/Decaff Intake:  9:00pm NPO After: 7:00pm   Lungs:  clear O2 Sat: 98% on room air. IV 0.9% NS with Angio Cath:  22g  IV Site: L Forearm  IV Started by:  Cathlyn Parsons, RN  Chest Size (in):  42 Cup Size: DD  Height: 5\' 6"  (1.676 m)  Weight:  192 lb (87.091 kg)  BMI:  Body mass index is 30.99 kg/(m^2). Tech Comments:  n/a    Nuclear Med Study 1 or 2 day study: 1 day  Stress Test Type:  Stress  Reading MD: Olga Millers, MD  Order Authorizing Provider:  Elita Quick Paz,MD  Resting Radionuclide: Technetium 59m Tetrofosmin  Resting Radionuclide Dose: 10.1 mCi   Stress Radionuclide:  Technetium 5m Tetrofosmin  Stress Radionuclide Dose: 32.5 mCi           Stress Protocol Rest HR: 67 Stress HR: 148  Rest BP: 119/73 Stress BP: 186/76  Exercise Time (min): 7:46 METS: 9.7   Predicted Max HR: 167 bpm % Max HR: 88.62 bpm Rate Pressure Product: 21308   Dose of Adenosine (mg):  n/a Dose of Lexiscan: n/a mg  Dose of Atropine (mg): n/a Dose of Dobutamine: n/a mcg/kg/min (at max HR)  Stress Test Technologist: Smiley Houseman, CMA-N  Nuclear Technologist:  Domenic Polite, CNMT     Rest Procedure:  Myocardial perfusion imaging was performed at rest 45 minutes following the intravenous administration of Technetium 91m Tetrofosmin.  Rest ECG: No acute changes.  Stress Procedure:  The patient walked on the treadmill utilizing the Bruce protocol for 7:46 minutes.   She then stopped due to fatigue and dyspnea with a peak O2 SAT of 88%.   She denied any chest pain.  There were no diagnostic ST-T wave changes.  Technetium 91m Tetrofosmin was injected at peak exercise and myocardial perfusion imaging was performed after a brief delay.  Stress ECG: No significant ST segment change suggestive of ischemia.  QPS Raw Data Images:  There is a breast shadow. Stress Images:  Normal homogeneous uptake in all areas of the myocardium. Rest Images:  Normal homogeneous uptake in all areas of the myocardium. Subtraction (SDS):  No evidence of ischemia. Transient Ischemic Dilatation (Normal <1.22):  0.98 Lung/Heart Ratio (Normal <0.45):  0.35  Quantitative Gated Spect Images QGS EDV:  62 ml QGS ESV:  14 ml  Impression Exercise Capacity:  Fair exercise capacity. BP Response:  Normal blood pressure response. Clinical Symptoms:  There is dyspnea. ECG Impression:  No significant ST segment change suggestive of ischemia. Comparison with Prior Nuclear Study: No previous nuclear study performed  Overall Impression:  Normal stress nuclear study.  LV Ejection Fraction: 77%.  LV Wall Motion:  NL LV Function; NL Wall Motion   Olga Millers

## 2012-01-21 ENCOUNTER — Encounter: Payer: Self-pay | Admitting: Family

## 2012-01-21 ENCOUNTER — Telehealth: Payer: Self-pay | Admitting: Internal Medicine

## 2012-01-21 ENCOUNTER — Ambulatory Visit (INDEPENDENT_AMBULATORY_CARE_PROVIDER_SITE_OTHER): Payer: 59 | Admitting: Family

## 2012-01-21 ENCOUNTER — Ambulatory Visit (HOSPITAL_BASED_OUTPATIENT_CLINIC_OR_DEPARTMENT_OTHER)
Admission: RE | Admit: 2012-01-21 | Discharge: 2012-01-21 | Disposition: A | Discharge status: Home / Self Care | Payer: 59 | Source: Ambulatory Visit | Attending: Family | Admitting: Family

## 2012-01-21 VITALS — BP 134/84 | HR 78 | Temp 98.1°F | Resp 16 | Wt 187.1 lb

## 2012-01-21 DIAGNOSIS — R05 Cough: Secondary | ICD-10-CM

## 2012-01-21 DIAGNOSIS — R059 Cough, unspecified: Secondary | ICD-10-CM

## 2012-01-21 DIAGNOSIS — R0989 Other specified symptoms and signs involving the circulatory and respiratory systems: Secondary | ICD-10-CM | POA: Insufficient documentation

## 2012-01-21 DIAGNOSIS — J40 Bronchitis, not specified as acute or chronic: Secondary | ICD-10-CM

## 2012-01-21 MED ORDER — CEFUROXIME AXETIL 500 MG PO TABS: 500.0000 mg | ORAL_TABLET | Freq: Two times a day (BID) | ORAL | Status: DC

## 2012-01-21 MED ORDER — BENZONATATE 100 MG PO CAPS: 100.0000 mg | ORAL_CAPSULE | Freq: Three times a day (TID) | ORAL | Status: DC | PRN

## 2012-01-21 NOTE — Telephone Encounter (Signed)
To be seen @ [facility]

## 2012-01-21 NOTE — Telephone Encounter (Signed)
Patient calling. ; PCP: Willow Ora; CB#:[phone] or spouse   [phone]; Call regarding Cough/Congestion; Onset sx 01/18/12.  Afebrile/subjective.  Clear sputum.  Temp 99.2 temp by ear on 01/20/12.  Sharp, fleeting chest pain only w/ cough and not responsive to home care with OTC med  (Cough/ Congestion Relief DM Expectorant and Cough Suppressant).  Home care for the interim and parameters for callback given.  Appt. Scheduled at  14:45 w/ M. Peggyann Juba at [facility] office Cough protocol used.

## 2012-01-21 NOTE — Assessment & Plan Note (Signed)
CXR is performed and is neg for PNA.  Will plan to treat empirically for bronchitis due to fever 101 and severe cough. Add tessalon prn cough. Spoke to pt on phone and advised her of results and medications.

## 2012-01-21 NOTE — Patient Instructions (Signed)
Please complete your chest x ray on the first floor. We will contact you with the results.          

## 2012-01-21 NOTE — Progress Notes (Signed)
  Subjective:    Patient ID: Paula Leblanc, female    DOB: January 06, 1961, 51 y.o.   MRN: [id number]  HPI  [name] is a 51 yr old female who presents today with chief complaint of cough.  Symptoms started 6 days ago.  Cough is productive at times.  She reports + associated fever last night- 101.  + nasal congestion, ears blocked, sore throat. Hurts to cough.  + fatigue. She denies sick contacts.    Review of Systems See HPI  No past medical history on file.  History   Social History  . Marital Status: Married    Spouse Name: N/A    Number of Children: 1  . Years of Education: N/A   Occupational History  . office manager, law firm    Social History Main Topics  . Smoking status: Never Smoker   . Smokeless tobacco: Never Used  . Alcohol Use: No  . Drug Use: No  . Sexually Active: Not on file   Other Topics Concern  . Not on file   Social History Narrative   Married w/ Paula Leblanc, daughter in law of Paula Leblanc....    Past Surgical History  Procedure Date  . Cholecystectomy     Family History  Problem Relation Age of Onset  . Coronary artery disease Father   . Colon cancer Father   . Diabetes Father   . Heart attack Father 51  . Hyperlipidemia Mother   . Osteoarthritis Mother     Allergies  Allergen Reactions  . Erythromycin     REACTION: Confusion  . Sulfonamide Derivatives     Current Outpatient Prescriptions on File Prior to Visit  Medication Sig Dispense Refill  . calcium carbonate (TUMS EX) 750 MG chewable tablet Chew 1 tablet by mouth daily.        . cyanocobalamin 100 MCG tablet Take 100 mcg by mouth daily.        . fish oil-omega-3 fatty acids 1000 MG capsule Take 2 g by mouth daily.        . multivitamin (THERAGRAN) per tablet Take 1 tablet by mouth daily.        Marland Kitchen zolpidem (AMBIEN) 10 MG tablet Take 1 tablet (10 mg total) by mouth at bedtime as needed.  90 tablet  1    BP 134/84  Pulse 78  Temp(Src) 98.1 F (36.7 C) (Oral)  Resp 16  Wt 187  lb 1.3 oz (84.859 kg)  SpO2 98%       Objective:   Physical Exam  Constitutional: She appears well-developed and well-nourished.  HENT:  Head: Normocephalic and atraumatic.  Right Ear: Tympanic membrane and ear canal normal.  Left Ear: Tympanic membrane and ear canal normal.       Mild erythema post oropharynx, no edema or exudates.  Cardiovascular: Normal rate and regular rhythm.   No murmur heard. Pulmonary/Chest: Effort normal and breath sounds normal. No respiratory distress. She has no wheezes. She has no rales. She exhibits no tenderness.       Coarse cough noted.  Abdominal: Soft. Bowel sounds are normal.  Musculoskeletal: She exhibits no edema.  Skin: Skin is warm and dry. No rash noted. No erythema. No pallor.  Psychiatric: She has a normal mood and affect. Her behavior is normal. Judgment and thought content normal.          Assessment & Plan:

## 2012-01-23 ENCOUNTER — Encounter: Payer: Self-pay | Admitting: *Deleted

## 2012-01-23 ENCOUNTER — Ambulatory Visit (INDEPENDENT_AMBULATORY_CARE_PROVIDER_SITE_OTHER): Payer: 59 | Admitting: Internal Medicine

## 2012-01-23 VITALS — BP 122/80 | HR 80 | Temp 98.6°F | Wt 186.0 lb

## 2012-01-23 DIAGNOSIS — J40 Bronchitis, not specified as acute or chronic: Secondary | ICD-10-CM

## 2012-01-23 MED ORDER — DOXYCYCLINE HYCLATE 100 MG PO TABS: 100.0000 mg | ORAL_TABLET | Freq: Two times a day (BID) | ORAL | Status: AC

## 2012-01-23 MED ORDER — HYDROCODONE-HOMATROPINE 5-1.5 MG/5ML PO SYRP: 5.0000 mL | ORAL_SOLUTION | Freq: Four times a day (QID) | ORAL | Status: AC | PRN

## 2012-01-23 MED ORDER — AZELASTINE HCL 0.1 % NA SOLN: 2.0000 | Freq: Two times a day (BID) | NASAL | Status: DC

## 2012-01-23 NOTE — Assessment & Plan Note (Addendum)
Persistent cough and fever, chest x-ray 2 days ago show no pneumonia , today has  no crackles on exam. The patient requests a change in antibiotics Plan: See instructions

## 2012-01-23 NOTE — Progress Notes (Signed)
  Subjective:    Patient ID: Paula Leblanc, female    DOB: 08/31/1961, 51 y.o.   MRN: [id number]  HPI Acute visit Symptoms started approximately 01/15/2012 with cough and fever. She was seen 2 days ago at another location, chest x-ray was negative, she was diagnosed with bronchitis, prescribe Ceftin-Tessalon. Here today because she's not feeling better.  PMH  Insomnia  FH:  Cad-- GM x 2, F at age 1  SH:  No tobacco  ETOH-- rarely  Occupation-- Social worker firm (books)   Review of Systems She had a temperature of 102.2 last night. Still coughing up a yellow-green sputum, no hemoptysis. Continue with sinus pain and congestion as well as green discharge. Denies shortness of breath but admits to some wheezing, no h/o tobacco or asthma. No nausea, vomiting, diarrhea.     Objective:   Physical Exam  General -- alert, well-developed. No apparent distress.  HEENT -- TMs normal, throat w/o redness, face symmetric and  tender to palpation through the sinuses, nose not congested.  EOMI  Lungs -- normal respiratory effort, no intercostal retractions, no accessory muscle use, and few ronchi B, worse w/ ciugh, no crakles or wheezing Heart-- normal rate, regular rhythm, no murmur, and no gallop.   Extremities-- no pretibial edema bilaterally  Psych-- Cognition and judgment appear intact. Alert and cooperative with normal attention span and concentration.  not anxious appearing and not depressed appearing.      Assessment & Plan:

## 2012-01-23 NOTE — Patient Instructions (Addendum)
Rest, fluids , tylenol For cough, take Mucinex DM twice a day as needed  If the cough continue, use hydrocodone , will make you drowsy astelin nasal spray as prescribed for nasal congestion; take  until you feel better  Take the antibiotic as prescribed  (doxycycline) Call if no better in few days Call anytime if the symptoms are severe, you have high fever, short of breath

## 2012-01-24 ENCOUNTER — Encounter: Payer: Self-pay | Admitting: Internal Medicine

## 2012-06-16 ENCOUNTER — Ambulatory Visit: Payer: 59 | Admitting: Internal Medicine

## 2013-06-17 ENCOUNTER — Ambulatory Visit (INDEPENDENT_AMBULATORY_CARE_PROVIDER_SITE_OTHER): Payer: 59 | Admitting: Family Medicine

## 2013-06-17 ENCOUNTER — Encounter: Payer: Self-pay | Admitting: Family Medicine

## 2013-06-17 VITALS — BP 116/76 | HR 63 | Temp 98.6°F | Wt 188.4 lb

## 2013-06-17 DIAGNOSIS — H6120 Impacted cerumen, unspecified ear: Secondary | ICD-10-CM

## 2013-06-17 DIAGNOSIS — H918X9 Other specified hearing loss, unspecified ear: Secondary | ICD-10-CM

## 2013-06-17 NOTE — Progress Notes (Signed)
  Subjective:    Patient ID: Paula Leblanc, female    DOB: [birth year], 52 y.o.   MRN: [id number]  HPI Hearing loss- showered yesterday and was drying ears when she noted it felt like there was water in the ear.  Used Qtip and then noted that she had decreased hearing, R>L.  No pain in the ear.  No drainage.   Review of Systems For ROS see HPI     Objective:   Physical Exam  Vitals reviewed. Constitutional: She appears well-developed and well-nourished. No distress.  HENT:  Head: Normocephalic and atraumatic.  Cerumen impaction bilaterally, R>L.  Cerumen unable to be removed by curette due to depth and proximity to TM.  Successfully irrigated.  Hearing instantly improved.          Assessment & Plan:

## 2013-06-17 NOTE — Assessment & Plan Note (Signed)
New.  Pt's hearing immediately improved after successful removal of wax.  Pt tolerated irrigation w/out difficulty

## 2014-05-07 ENCOUNTER — Encounter: Payer: 59 | Admitting: Internal Medicine

## 2014-06-16 ENCOUNTER — Encounter: Payer: Self-pay | Admitting: Internal Medicine

## 2014-07-13 ENCOUNTER — Encounter: Payer: Self-pay | Admitting: Internal Medicine

## 2014-07-13 ENCOUNTER — Ambulatory Visit (INDEPENDENT_AMBULATORY_CARE_PROVIDER_SITE_OTHER): Payer: 59 | Admitting: Internal Medicine

## 2014-07-13 VITALS — BP 113/74 | HR 71 | Temp 98.2°F | Ht 66.0 in | Wt 186.0 lb

## 2014-07-13 DIAGNOSIS — Z23 Encounter for immunization: Secondary | ICD-10-CM

## 2014-07-13 DIAGNOSIS — Z0000 Encounter for general adult medical examination without abnormal findings: Secondary | ICD-10-CM

## 2014-07-13 DIAGNOSIS — R079 Chest pain, unspecified: Secondary | ICD-10-CM

## 2014-07-13 LAB — FOLATE

## 2014-07-13 LAB — CBC WITH DIFFERENTIAL/PLATELET
BASOS ABS: 0 10*3/uL (ref 0.0–0.1)
Basophils Relative: 0.7 % (ref 0.0–3.0)
EOS ABS: 0.2 10*3/uL (ref 0.0–0.7)
Eosinophils Relative: 2.6 % (ref 0.0–5.0)
HEMATOCRIT: 43.7 % (ref 36.0–46.0)
Hemoglobin: 14.1 g/dL (ref 12.0–15.0)
LYMPHS ABS: 2.2 10*3/uL (ref 0.7–4.0)
Lymphocytes Relative: 29.1 % (ref 12.0–46.0)
MCHC: 32.2 g/dL (ref 30.0–36.0)
MCV: 90.2 fl (ref 78.0–100.0)
MONO ABS: 0.5 10*3/uL (ref 0.1–1.0)
MONOS PCT: 6.2 % (ref 3.0–12.0)
NEUTROS ABS: 4.6 10*3/uL (ref 1.4–7.7)
Neutrophils Relative %: 61.4 % (ref 43.0–77.0)
PLATELETS: 239 10*3/uL (ref 150.0–400.0)
RBC: 4.84 Mil/uL (ref 3.87–5.11)
RDW: 13.2 % (ref 11.5–15.5)
WBC: 7.4 10*3/uL (ref 4.0–10.5)

## 2014-07-13 LAB — COMPREHENSIVE METABOLIC PANEL
ALT: 21 U/L (ref 0–35)
AST: 22 U/L (ref 0–37)
Albumin: 3.7 g/dL (ref 3.5–5.2)
Alkaline Phosphatase: 70 U/L (ref 39–117)
BUN: 14 mg/dL (ref 6–23)
CHLORIDE: 107 meq/L (ref 96–112)
CO2: 31 meq/L (ref 19–32)
Calcium: 10.3 mg/dL (ref 8.4–10.5)
Creatinine, Ser: 0.7 mg/dL (ref 0.4–1.2)
GFR: 96.9 mL/min (ref >60.00)
Glucose, Bld: 91 mg/dL (ref 70–99)
Potassium: 5 mEq/L (ref 3.5–5.1)
Sodium: 145 mEq/L (ref 135–145)
Total Bilirubin: 0.8 mg/dL (ref 0.2–1.2)
Total Protein: 7.4 g/dL (ref 6.0–8.3)

## 2014-07-13 LAB — LIPID PANEL
Cholesterol: 212 mg/dL — ABNORMAL HIGH (ref 0–200)
HDL: 33.7 mg/dL — AB (ref >39.00)
NONHDL: 178.3
Total CHOL/HDL Ratio: 6
Triglycerides: 219 mg/dL — ABNORMAL HIGH (ref 0.0–149.0)
VLDL: 43.8 mg/dL — ABNORMAL HIGH (ref 0.0–40.0)

## 2014-07-13 LAB — VITAMIN D 25 HYDROXY (VIT D DEFICIENCY, FRACTURES): VITD: 38.16 ng/mL (ref 30.00–100.00)

## 2014-07-13 LAB — VITAMIN B12: Vitamin B-12: >1500 pg/mL — ABNORMAL HIGH (ref 211–911)

## 2014-07-13 LAB — TSH: TSH: 0.83 u[IU]/mL (ref 0.35–4.50)

## 2014-07-13 NOTE — Patient Instructions (Signed)
Get your blood work before you leave    Take calcium 1 gram  and vitamin D between 600 units and 1000 units daily  Please come back to the office in 1 year  for a physical exam. Come back fasting

## 2014-07-13 NOTE — Assessment & Plan Note (Signed)
Stress test 2013 neg, asx

## 2014-07-13 NOTE — Progress Notes (Signed)
Pre visit review using our clinic review tool, if applicable. No additional management support is needed unless otherwise documented below in the visit note. 

## 2014-07-13 NOTE — Progress Notes (Signed)
   Subjective:    Patient ID: Paula Leblanc, female    DOB: 07-Jun-1961, 53 y.o.   MRN: [id number]  DOS:  07/13/2014 Type of visit - description : CPX Interval history: Feeling slt tired lately ; occ L hip pain    ROS Diet-- W W, doing well  Exercise-- trying to be more active  No  CP, SOB.  no lower extremity edema Denies  nausea, vomiting diarrhea, blood in the stools  (-) cough, sputum production (-) wheezing, chest congestion No dysuria, gross hematuria, difficulty urinating  No vaginal discharge, bleed. no anxiety, depression    History reviewed. No pertinent past medical history.  Past Surgical History  Procedure Laterality Date  . Cholecystectomy    . Cataract extraction Bilateral     L 2004, R 2006    History   Social History  . Marital Status: Married    Spouse Name: N/A    Number of Children: 1  . Years of Education: N/A   Occupational History  . office manager, law firm    Social History Main Topics  . Smoking status: Never Smoker   . Smokeless tobacco: Never Used  . Alcohol Use: No  . Drug Use: No  . Sexual Activity: Not on file   Other Topics Concern  . Not on file   Social History Narrative   Married w/ Almond Lint, daughter in law of Darnise Montag            Family History  Problem Relation Age of Onset  . Colon cancer Father   . Diabetes Father   . Heart attack Father 66  . Hyperlipidemia Mother   . Osteoarthritis Mother   . Breast cancer Neg Hx   . Stroke Father 46      Medication List       This list is accurate as of: 07/13/14  5:55 PM.  Always use your most recent med list.               calcium carbonate 750 MG chewable tablet  Commonly known as:  TUMS EX  Chew 1 tablet by mouth daily.     cyanocobalamin 100 MCG tablet  Take 100 mcg by mouth daily.     multivitamin per tablet  Take 1 tablet by mouth daily.           Objective:   Physical Exam BP 113/74  Pulse 71  Temp(Src) 98.2 F (36.8 C) (Oral)  Ht 5'  6" (1.676 m)  Wt 186 lb (84.369 kg)  BMI 30.04 kg/m2  SpO2 98% General -- alert, well-developed, NAD.  Neck --no thyromegaly , normal carotid pulse  HEENT-- Not pale.  Lungs -- normal respiratory effort, no intercostal retractions, no accessory muscle use, and normal breath sounds.  Heart-- normal rate, regular rhythm, no murmur.  Abdomen-- Not distended, good bowel sounds,soft, non-tender. Extremities-- no pretibial edema bilaterally  Neurologic--  alert & oriented X3. Speech normal, gait appropriate for age, strength symmetric and appropriate for age.  Psych-- Cognition and judgment appear intact. Cooperative with normal attention span and concentration. No anxious or depressed appearing.      Assessment & Plan:

## 2014-07-13 NOTE — Assessment & Plan Note (Addendum)
Td 2005 Flu shot--today  Female care per gyn, last visit ~ 2012 , h/o  u/s and a Bx done (cervical? Endometrial?)-- strongle encouraged to see gyn [facility]-- due for  , order entered  Bones-- rec ca and vit D DEXA, never, ordering one   cscope 11-2010 , tics, Dr Carlean Purl, next 5 years d/t FH Some fatigue-- labs, call if sx increase  Discussed diet (cont [employer]) and exercise

## 2014-07-14 LAB — LDL CHOLESTEROL, DIRECT: Direct LDL: 138.6 mg/dL

## 2014-08-02 ENCOUNTER — Encounter: Payer: Self-pay | Admitting: Internal Medicine

## 2014-08-13 ENCOUNTER — Ambulatory Visit: Payer: 59

## 2014-08-19 ENCOUNTER — Ambulatory Visit
Admission: RE | Admit: 2014-08-19 | Discharge: 2014-08-19 | Disposition: A | Discharge status: Home / Self Care | Payer: 59 | Source: Ambulatory Visit | Attending: Internal Medicine | Admitting: Internal Medicine

## 2014-08-19 DIAGNOSIS — Z0000 Encounter for general adult medical examination without abnormal findings: Secondary | ICD-10-CM

## 2015-03-28 ENCOUNTER — Encounter: Payer: Self-pay | Admitting: Medical

## 2015-03-28 ENCOUNTER — Ambulatory Visit (INDEPENDENT_AMBULATORY_CARE_PROVIDER_SITE_OTHER): Payer: 59 | Admitting: Medical

## 2015-03-28 VITALS — BP 137/55 | HR 77 | Temp 99.1°F | Ht 66.0 in | Wt 199.4 lb

## 2015-03-28 DIAGNOSIS — S6980XA Other specified injuries of unspecified wrist, hand and finger(s), initial encounter: Secondary | ICD-10-CM | POA: Diagnosis not present

## 2015-03-28 DIAGNOSIS — J329 Chronic sinusitis, unspecified: Secondary | ICD-10-CM | POA: Insufficient documentation

## 2015-03-28 DIAGNOSIS — IMO0002 Reserved for concepts with insufficient information to code with codable children: Secondary | ICD-10-CM | POA: Insufficient documentation

## 2015-03-28 DIAGNOSIS — J0100 Acute maxillary sinusitis, unspecified: Secondary | ICD-10-CM

## 2015-03-28 MED ORDER — AMOXICILLIN-POT CLAVULANATE 875-125 MG PO TABS: 1.0000 | ORAL_TABLET | Freq: Two times a day (BID) | ORAL | Status: DC

## 2015-03-28 MED ORDER — HYDROCODONE-HOMATROPINE 5-1.5 MG/5ML PO SYRP: 5.0000 mL | ORAL_SOLUTION | Freq: Three times a day (TID) | ORAL | Status: DC | PRN

## 2015-03-28 MED ORDER — FLUTICASONE PROPIONATE 50 MCG/ACT NA SUSP: 2.0000 | Freq: Every day | NASAL | Status: DC

## 2015-03-28 NOTE — Progress Notes (Signed)
   Subjective:    Patient ID: Paula Leblanc, female    DOB: March 27, 1961, 54 y.o.   MRN: [id number]  HPI   Pt in with sinus pressure and she thinks she has bronchitis as well. Pt states one week of sinus pressure. This got worse on Saturday. Then just yesterday sarted to cough up thick brown mucous. Pt states a while since had bronchitis. Pt not a smoker.  Pt has sneezing, itching and runny nose preceding the sinus pressure. Pt has been in [place] and her sinus felt like flared. Also some in [place]. Traveling since Feb 26, 2015.   Pt has ony been on sudafed, benadryl and some cough medicine.  Pt stubbed her right big toe last Wednesday. Pt states not a lot of pain. Accidentaly bumped freezer door. She did not notice injury until later when noted mid bleed.   Review of Systems  Constitutional: Negative for fever, chills and fatigue.  HENT: Positive for congestion, ear pain, postnasal drip and sinus pressure.   Respiratory: Positive for cough. Negative for shortness of breath and wheezing.   Cardiovascular: Negative for chest pain and palpitations.  Gastrointestinal: Negative for abdominal pain.  Musculoskeletal: Negative for back pain.  Skin:       Rt great toes nail crack.  Neurological: Negative for dizziness and headaches.  Hematological: Negative for adenopathy. Does not bruise/bleed easily.  Psychiatric/Behavioral: Negative for behavioral problems and confusion.       Objective:   Physical Exam   General  Mental Status - Alert. General Appearance - Well groomed. Not in acute distress.  Skin Rashes- No Rashes.  HEENT Head- Normal. Ear Auditory Canal - Left- Normal. Right - Normal.Tympanic Membrane- Left- red. Right- red Eye Sclera/Conjunctiva- Left- Normal. Right- Normal. Nose & Sinuses Nasal Mucosa- Left-  Boggy and Congested. Right-  Boggy and  Congested.Bilateral maxillary tender  But no frontal sinus pressure. Mouth & Throat Lips: Upper Lip- Normal: no  dryness, cracking, pallor, cyanosis, or vesicular eruption. Lower Lip-Normal: no dryness, cracking, pallor, cyanosis or vesicular eruption. Buccal Mucosa- Bilateral- No Aphthous ulcers. Oropharynx- No Discharge or Erythema. +pnd Tonsils: Characteristics- Bilateral- No Erythema or Congestion. Size/Enlargement- Bilateral- No enlargement. Discharge- bilateral-None.  Neck Neck- Supple. No Masses.   Chest and Lung Exam Auscultation: Breath Sounds:-Clear even and unlabored.  Cardiovascular Auscultation:Rythm- Regular, rate and rhythm. Murmurs & Other Heart Sounds:Ausculatation of the heart reveal- No Murmurs.  Lymphatic Head & Neck General Head & Neck Lymphatics: Bilateral: Description- No Localized lymphadenopathy.  Rt great toe- not tender to palpation. No swelling or warmth. No bleeding. But distal aspect of nail has crack across the nail and hanging by small piece.      Assessment & Plan:

## 2015-03-28 NOTE — Assessment & Plan Note (Signed)
With bilateral om. And some preceding allergic rhinitis signs and symptoms. Rx augmentin antibiotic, flonase nasal spray and hycodan cough syrup.

## 2015-03-28 NOTE — Assessment & Plan Note (Addendum)
Rt great toenial. Distal 1/3 aspect of nail Crack across the nail. Barely hanging on. Thought best to remove that dangling portion of nail with scissors  to prevent traumatic injury or ripping /cracking towards the base of the nail.  Remaining 2/3 proximal portion of nail looks good. Should regrow.

## 2015-03-28 NOTE — Progress Notes (Signed)
Pre visit review using our clinic review tool, if applicable. No additional management support is needed unless otherwise documented below in the visit note. 

## 2015-03-28 NOTE — Patient Instructions (Addendum)
Sinusitis With bilateral om. And some preceding allergic rhinitis signs and symptoms. Rx augmentin antibiotic, flonase nasal spray and hycodan cough syrup.  Nailbed injury Rt great toenial. Distal 1/3 aspect of nail Crack across the nail. Barely hanging on. Thought best to remove that dangling portion of nail with scissors  to prevent traumatic injury or ripping /cracking towards the base of the nail.  Remaining 2/3 proximal portion of nail looks good. Should regrow.    Follow up in 7-10 days or as needed

## 2015-04-05 ENCOUNTER — Telehealth: Payer: Self-pay | Admitting: Internal Medicine

## 2015-04-05 MED ORDER — HYDROCODONE-HOMATROPINE 5-1.5 MG/5ML PO SYRP: 5.0000 mL | ORAL_SOLUTION | Freq: Three times a day (TID) | ORAL | Status: DC | PRN

## 2015-04-05 NOTE — Telephone Encounter (Signed)
Last OV:  03/28/15 Sinusitis Last Filled:  03/28/15 Amt:  120 ml, 0 refills  Please advise.

## 2015-04-05 NOTE — Telephone Encounter (Signed)
Relation to pt: self  Call back number: [phone] Pharmacy: [id number] - [place], [place] [phone] (Phone) [phone] (Fax)         Reason for call:  Pt requesting a refill HYDROcodone-homatropine (HYCODAN) 5-1.5 MG/5ML syrup

## 2015-04-05 NOTE — Telephone Encounter (Signed)
Rx printed and placed on Saguier's desk for review and signature.  Left a message on patient's mobile for call back.

## 2015-04-05 NOTE — Telephone Encounter (Signed)
Pt checking on the status of medication request. Advised pt RX is awaiting signature.

## 2015-04-06 NOTE — Telephone Encounter (Signed)
Patient is calling again, request call back asap

## 2015-04-06 NOTE — Telephone Encounter (Signed)
Patients prescription placed at front desk for pick up. Left message on her answering machine.

## 2015-04-15 ENCOUNTER — Encounter: Payer: Self-pay | Admitting: Internal Medicine

## 2015-04-15 ENCOUNTER — Ambulatory Visit (INDEPENDENT_AMBULATORY_CARE_PROVIDER_SITE_OTHER): Payer: 59 | Admitting: Internal Medicine

## 2015-04-15 VITALS — BP 118/72 | HR 64 | Temp 97.7°F | Ht 66.0 in | Wt 200.5 lb

## 2015-04-15 DIAGNOSIS — J45909 Unspecified asthma, uncomplicated: Secondary | ICD-10-CM

## 2015-04-15 DIAGNOSIS — J4520 Mild intermittent asthma, uncomplicated: Secondary | ICD-10-CM | POA: Diagnosis not present

## 2015-04-15 DIAGNOSIS — J209 Acute bronchitis, unspecified: Secondary | ICD-10-CM | POA: Diagnosis not present

## 2015-04-15 HISTORY — DX: Unspecified asthma, uncomplicated: J45.909

## 2015-04-15 MED ORDER — DOXYCYCLINE HYCLATE 100 MG PO TBEC: 100.0000 mg | DELAYED_RELEASE_TABLET | Freq: Two times a day (BID) | ORAL | Status: DC

## 2015-04-15 MED ORDER — ALBUTEROL SULFATE HFA 108 (90 BASE) MCG/ACT IN AERS: 2.0000 | INHALATION_SPRAY | Freq: Four times a day (QID) | RESPIRATORY_TRACT | Status: DC | PRN

## 2015-04-15 MED ORDER — PREDNISONE 10 MG PO TABS: ORAL_TABLET | ORAL | Status: DC

## 2015-04-15 NOTE — Progress Notes (Signed)
Subjective:    Patient ID: Paula Leblanc, female    DOB: 04/09/1961, 54 y.o.   MRN: [id number]  DOS:  04/15/2015 Type of visit - description : acute Interval history: Sx started 03-21-15, seen 6-27, Rx Augmentin, symptoms improve but then resurface few days ago, currently with frequent cough, some sputum production, chest congestion, postnasal dripping. She also has some pain and tenderness at the left side of the face, is blowing green-yellow-abundant mucus from the nose   Review of Systems Subjective fever present, some chills. Chest pain or shortness of breath only with spells of cough. She has a history of asthma, lately has noted some wheezing   No past medical history on file.  Past Surgical History  Procedure Laterality Date  . Cholecystectomy    . Cataract extraction Bilateral     L 2004, R 2006    History   Social History  . Marital Status: Married    Spouse Name: N/A  . Number of Children: 1  . Years of Education: N/A   Occupational History  . office manager, law firm    Social History Main Topics  . Smoking status: Never Smoker   . Smokeless tobacco: Never Used  . Alcohol Use: No  . Drug Use: No  . Sexual Activity: Not on file   Other Topics Concern  . Not on file   Social History Narrative   Married w/ Paula Leblanc, daughter in law of Paula Leblanc               Medication List       This list is accurate as of: 04/15/15  1:14 PM.  Always use your most recent med list.               amoxicillin-clavulanate 875-125 MG per tablet  Commonly known as:  AUGMENTIN  Take 1 tablet by mouth 2 (two) times daily.     calcium carbonate 750 MG chewable tablet  Commonly known as:  TUMS EX  Chew 1 tablet by mouth daily.     cyanocobalamin 100 MCG tablet  Take 100 mcg by mouth daily.     fluticasone 50 MCG/ACT nasal spray  Commonly known as:  FLONASE  Place 2 sprays into both nostrils daily.     HYDROcodone-homatropine 5-1.5 MG/5ML syrup  Commonly  known as:  HYCODAN  Take 5 mLs by mouth every 8 (eight) hours as needed for cough.     multivitamin per tablet  Take 1 tablet by mouth daily.           Objective:   Physical Exam BP 118/72 mmHg  Pulse 64  Temp(Src) 97.7 F (36.5 C) (Oral)  Ht 5\' 6"  (1.676 m)  Wt 200 lb 8 oz (90.946 kg)  BMI 32.38 kg/m2  SpO2 97% General:   Well developed, well nourished . NAD.  HEENT:  Normocephalic . Face symmetric, atraumatic, maxillary sinuses is slightly TTP. EOMI Lungs:  Few rhonchi with cough along with large airway congestion. Increased expiratory time Normal respiratory effort, no intercostal retractions, no accessory muscle use. Heart: RRR,  no murmur.  No pretibial edema bilaterally  Skin: Not pale. Not jaundice Neurologic:  alert & oriented X3.  Speech normal, gait appropriate for age and unassisted Psych--  Cognition and judgment appear intact.  Cooperative with normal attention span and concentration.  Behavior appropriate. No anxious or depressed appearing.       Assessment & Plan:    Sinusitis, bronchitis Not improving after  a round of Augmentin, she has a history of asthma and has some prolonged expiratory time. Plan: Doxycycline, prednisone. See instructions Sent a prescription for albuterol although she will use only if symptoms increase because it make her shaky she said.

## 2015-04-15 NOTE — Progress Notes (Signed)
Pre visit review using our clinic review tool, if applicable. No additional management support is needed unless otherwise documented below in the visit note. 

## 2015-04-15 NOTE — Patient Instructions (Signed)
Rest, fluids , tylenol  Mucinex DM twice a day as needed until you better Continue Flonase Continue hycodan  for cough if needed  Prednisone for a few days as prescribed Take the antibiotic as prescribed  (doxycycline)  You also have a prescription for albuterol to take as needed for wheezing and persistent cough  Call if not gradually better over the next  10 days Call anytime if the symptoms are severe, you have high fever, short of breath, chest pain

## 2015-07-14 ENCOUNTER — Telehealth: Payer: Self-pay | Admitting: Behavioral Health

## 2015-07-14 NOTE — Telephone Encounter (Signed)
Unable to reach patient at time of Pre-Visit Call.  Left message for patient to return call when available.    

## 2015-07-15 ENCOUNTER — Ambulatory Visit (INDEPENDENT_AMBULATORY_CARE_PROVIDER_SITE_OTHER): Payer: 59 | Admitting: Internal Medicine

## 2015-07-15 ENCOUNTER — Encounter: Payer: Self-pay | Admitting: Internal Medicine

## 2015-07-15 VITALS — BP 108/66 | HR 68 | Temp 98.3°F | Ht 66.0 in | Wt 193.5 lb

## 2015-07-15 DIAGNOSIS — Z1159 Encounter for screening for other viral diseases: Secondary | ICD-10-CM

## 2015-07-15 DIAGNOSIS — Z114 Encounter for screening for human immunodeficiency virus [HIV]: Secondary | ICD-10-CM

## 2015-07-15 DIAGNOSIS — Z09 Encounter for follow-up examination after completed treatment for conditions other than malignant neoplasm: Secondary | ICD-10-CM

## 2015-07-15 DIAGNOSIS — Z01419 Encounter for gynecological examination (general) (routine) without abnormal findings: Secondary | ICD-10-CM

## 2015-07-15 DIAGNOSIS — Z23 Encounter for immunization: Secondary | ICD-10-CM | POA: Diagnosis not present

## 2015-07-15 DIAGNOSIS — Z0000 Encounter for general adult medical examination without abnormal findings: Secondary | ICD-10-CM | POA: Diagnosis not present

## 2015-07-15 LAB — LDL CHOLESTEROL, DIRECT: LDL DIRECT: 105 mg/dL

## 2015-07-15 LAB — BASIC METABOLIC PANEL
BUN: 11 mg/dL (ref 6–23)
CALCIUM: 9.5 mg/dL (ref 8.4–10.5)
CO2: 33 mEq/L — ABNORMAL HIGH (ref 19–32)
Chloride: 109 mEq/L (ref 96–112)
Creatinine, Ser: 0.69 mg/dL (ref 0.40–1.20)
GFR: 93.33 mL/min (ref >60.00)
GLUCOSE: 95 mg/dL (ref 70–99)
Potassium: 3.9 mEq/L (ref 3.5–5.1)
Sodium: 148 mEq/L — ABNORMAL HIGH (ref 135–145)

## 2015-07-15 LAB — TSH: TSH: 1.74 u[IU]/mL (ref 0.35–4.50)

## 2015-07-15 LAB — LIPID PANEL
Cholesterol: 199 mg/dL (ref 0–200)
HDL: 37.8 mg/dL — ABNORMAL LOW (ref >39.00)
NonHDL: 161.37
Total CHOL/HDL Ratio: 5
Triglycerides: 369 mg/dL — ABNORMAL HIGH (ref 0.0–149.0)
VLDL: 73.8 mg/dL — ABNORMAL HIGH (ref 0.0–40.0)

## 2015-07-15 NOTE — Progress Notes (Signed)
Pre visit review using our clinic review tool, if applicable. No additional management support is needed unless otherwise documented below in the visit note. 

## 2015-07-15 NOTE — Assessment & Plan Note (Addendum)
Td 2005 pnm shot--today (h/o asthma) Had a flu shot   Female care per gyn, last visit ~ 2012 , needs to see gyn (no ob), previous one retired (dr Laurette Schimke), will arrange  MMG-- 08-2014  DEXA, never done , had a late menopause   cscope 11-2010 , tics, Dr Carlean Purl, next 5 years d/t FH   Diet and exercise better, recommend calcium and vitamin D.

## 2015-07-15 NOTE — Progress Notes (Signed)
Subjective:    Patient ID: Paula Leblanc, female    DOB: 1961-07-01, 54 y.o.   MRN: [id number]  DOS:  07/15/2015 Type of visit - description : CPX Interval history: no major concerns    Review of Systems  Constitutional: No fever. No chills. No unexplained wt changes. No unusual sweats  HEENT: No dental problems, no ear discharge, no facial swelling, no voice changes. No eye discharge, no eye  redness , no  intolerance to light   Respiratory: No wheezing , no  difficulty breathing. No cough , no mucus production  Cardiovascular: No CP, no leg swelling , no  Palpitations  GI: no nausea, no vomiting, no diarrhea , no  abdominal pain.  No blood in the stools. No dysphagia, no odynophagia    Endocrine: No polyphagia, no polyuria , no polydipsia  GU: No dysuria, gross hematuria, difficulty urinating. No urinary urgency, no frequency.  Musculoskeletal: has hand  DJD, symptoms   slightl worse lately  Skin: No change in the color of the skin, palor , no  Rash  Allergic, immunologic: No environmental allergies , no  food allergies  Neurological: No dizziness no  syncope. No headaches. No diplopia, no slurred, no slurred speech, no motor deficits, no facial  Numbness  Hematological: No enlarged lymph nodes, no easy bruising , no unusual bleedings  Psychiatry: No suicidal ideas, no hallucinations, no beavior problems, no confusion.  No unusual/severe anxiety, no depression  Past Medical History  Diagnosis Date  . Intrinsic asthma 04/15/2015    Past Surgical History  Procedure Laterality Date  . Cholecystectomy    . Cataract extraction Bilateral     L 2004, R 2006    Social History   Social History  . Marital Status: Married    Spouse Name: N/A  . Number of Children: 1  . Years of Education: N/A   Occupational History  . office manager, law firm    Social History Main Topics  . Smoking status: Never Smoker   . Smokeless tobacco: Never Used  . Alcohol Use: No    . Drug Use: No  . Sexual Activity: Not on file   Other Topics Concern  . Not on file   Social History Narrative   Married w/ Almond Lint, daughter in law of Paula Leblanc            Family History  Problem Relation Age of Onset  . Colon cancer Father 83  . Diabetes Father   . Heart attack Father 72  . Hyperlipidemia Mother   . Osteoarthritis Mother   . Breast cancer Neg Hx   . Stroke Father 81      Medication List       This list is accurate as of: 07/15/15 11:59 PM.  Always use your most recent med list.               albuterol 108 (90 BASE) MCG/ACT inhaler  Commonly known as:  VENTOLIN HFA  Inhale 2 puffs into the lungs every 6 (six) hours as needed for wheezing or shortness of breath.     calcium carbonate 750 MG chewable tablet  Commonly known as:  TUMS EX  Chew 1 tablet by mouth daily.     cyanocobalamin 100 MCG tablet  Take 100 mcg by mouth daily.     fluticasone 50 MCG/ACT nasal spray  Commonly known as:  FLONASE  Place 2 sprays into both nostrils daily.     multivitamin per  tablet  Take 1 tablet by mouth daily.           Objective:   Physical Exam BP 108/66 mmHg  Pulse 68  Temp(Src) 98.3 F (36.8 C) (Oral)  Ht 5\' 6"  (1.676 m)  Wt 193 lb 8 oz (87.771 kg)  BMI 31.25 kg/m2  SpO2 96% General:   Well developed, well nourished . NAD.  HEENT:  Normocephalic . Face symmetric, atraumatic Neck- no thyromegaly Lungs:  CTA B Normal respiratory effort, no intercostal retractions, no accessory muscle use. Heart: RRR,  no murmur.  no pretibial edema bilaterally  Abdomen:  Not distended, soft, non-tender. No rebound or rigidity. No mass,organomegaly Skin: Not pale. Not jaundice MSK: hand w/ PIP-DIPs w/ deformities c/w DJD, no warm, hot  Neurologic:  alert & oriented X3.  Speech normal, gait appropriate for age and unassisted Psych--  Cognition and judgment appear intact.  Cooperative with normal attention span and concentration.  Behavior  appropriate. No anxious or depressed appearing.     Assessment & Plan:   Assessment > Asthma DJD, hands, saw ortho before, had XRs Menopause -- onset ~ 54 y/o  Plan  DJD: seems to have pronounced DJD hands, offered ref to rheumatology, states will see ortho one more time and see how that goes

## 2015-07-15 NOTE — Patient Instructions (Signed)
Get your blood work before you leave     Next visit  for a  complete physical exam in one year, fasting     Please schedule an appointment at the front desk

## 2015-07-16 DIAGNOSIS — Z09 Encounter for follow-up examination after completed treatment for conditions other than malignant neoplasm: Secondary | ICD-10-CM | POA: Insufficient documentation

## 2015-07-16 LAB — HEPATITIS C ANTIBODY: HCV AB: NEGATIVE

## 2015-07-16 LAB — HIV ANTIBODY (ROUTINE TESTING W REFLEX): HIV 1&2 Ab, 4th Generation: NONREACTIVE

## 2015-07-16 NOTE — Assessment & Plan Note (Signed)
DJD: seems to have pronounced DJD hands, offered ref to rheumatology, states will see ortho one more time and see how that goes

## 2015-08-05 ENCOUNTER — Ambulatory Visit: Payer: Self-pay | Admitting: Women's Health

## 2015-08-30 ENCOUNTER — Telehealth: Payer: Self-pay | Admitting: Internal Medicine

## 2015-08-30 NOTE — Telephone Encounter (Signed)
Caller name: Evora   Relationship to patient: Self   Can be reached: [phone]    Reason for call: Pt called in stating that she received a statement from her insurance carrier stating that her CPE labs was mis-coded. She says that they would need to be re-coded and sent back in before they would cover them. Informed pt that we would take care of this.    Please assist further.

## 2015-08-31 NOTE — Telephone Encounter (Signed)
Annual physical exam was coded on Jul 29, 2015 for visit.

## 2015-10-20 ENCOUNTER — Other Ambulatory Visit: Payer: Self-pay

## 2015-10-20 DIAGNOSIS — Z1231 Encounter for screening mammogram for malignant neoplasm of breast: Secondary | ICD-10-CM

## 2015-10-28 ENCOUNTER — Ambulatory Visit
Admission: RE | Admit: 2015-10-28 | Discharge: 2015-10-28 | Disposition: A | Discharge status: Home / Self Care | Payer: 59 | Source: Ambulatory Visit

## 2015-10-28 DIAGNOSIS — Z1231 Encounter for screening mammogram for malignant neoplasm of breast: Secondary | ICD-10-CM

## 2015-11-08 ENCOUNTER — Ambulatory Visit (INDEPENDENT_AMBULATORY_CARE_PROVIDER_SITE_OTHER): Payer: BLUE CROSS/BLUE SHIELD | Admitting: Women's Health

## 2015-11-08 ENCOUNTER — Other Ambulatory Visit (HOSPITAL_COMMUNITY)
Admission: RE | Admit: 2015-11-08 | Discharge: 2015-11-08 | Disposition: A | Discharge status: Home / Self Care | Payer: 59 | Source: Ambulatory Visit | Attending: Women's Health | Admitting: Women's Health

## 2015-11-08 ENCOUNTER — Encounter: Payer: Self-pay | Admitting: Women's Health

## 2015-11-08 VITALS — BP 130/82 | Ht 66.0 in | Wt 196.0 lb

## 2015-11-08 DIAGNOSIS — Z01419 Encounter for gynecological examination (general) (routine) without abnormal findings: Secondary | ICD-10-CM | POA: Diagnosis not present

## 2015-11-08 DIAGNOSIS — Z13820 Encounter for screening for osteoporosis: Secondary | ICD-10-CM

## 2015-11-08 DIAGNOSIS — Z1151 Encounter for screening for human papillomavirus (HPV): Secondary | ICD-10-CM | POA: Insufficient documentation

## 2015-11-08 NOTE — Patient Instructions (Signed)

## 2015-11-08 NOTE — Progress Notes (Signed)
ANONDA BLANCHET 1960-11-15 [id number]    History:    Presents for annual exam.  New patient, postmenopausal with no bleeding on no HRT. History of normal Paps and mammograms. Last Pap greater than 5 years ago,  Dr. Avanell Shackleton.  Negative colonoscopy 2012 is scheduled March 2017, father history of colon cancer. Labs are done at primary care. Does not had a DEXA.   Past medical history, past surgical history, family history and social history were all reviewed and documented in the EPIC chart. 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 Daughter working in [place] as an Psychologist, prison and probation services. Husband spinal stenosis.  ROS:  A ROS was performed and pertinent positives and negatives are included.  Exam:  Filed Vitals:   11/08/15 1400  BP: 130/82    General appearance:  Normal Thyroid:  Symmetrical, normal in size, without palpable masses or nodularity. Respiratory  Auscultation:  Clear without wheezing or rhonchi Cardiovascular  Auscultation:  Regular rate, without rubs, murmurs or gallops  Edema/varicosities:  Not grossly evident Abdominal  Soft,nontender, without masses, guarding or rebound.  Liver/spleen:  No organomegaly noted  Hernia:  None appreciated  Skin  Inspection:  Grossly normal   Breasts: Examined lying and sitting.     Right: Without masses, retractions, discharge or axillary adenopathy.     Left: Without masses, retractions, discharge or axillary adenopathy. Gentitourinary   Inguinal/mons:  Normal without inguinal adenopathy  External genitalia:  Normal  BUS/Urethra/Skene's glands:  Normal  Vagina:  Normal +1 cystocele mild atrophy  Cervix:  Normal  Uterus:   normal in size, shape and contour.  Midline and mobile  Adnexa/parametria:     Rt: Without masses or tenderness.   Lt: Without masses or tenderness.  Anus and perineum: Normal  Digital rectal exam: Normal sphincter tone without palpated masses or tenderness  Assessment/Plan:  55 y.o. MWF G1 P1 for annual exam with no  complaints.  Postmenopausal/no HRT/no bleeding/mild vaginal atrophy Obesity Labs-primary care  Plan: DEXA instructed to schedule, reviewed importance of regular exercise, home safety and fall prevention reviewed. Encouraged to continue Weight Watchers for weight loss. SBE's, continue annual screening mammogram. Keep scheduled follow-up appointment for colonoscopy. UA, Pap with HR HPV typing, new screening guidelines reviewed.   Huel Cote [facility], 2:46 PM 11/08/2015

## 2015-11-08 NOTE — Addendum Note (Signed)
Addended by: Burnett Kanaris on: 11/08/2015 03:09 PM   Modules accepted: Orders

## 2015-11-09 LAB — URINALYSIS W MICROSCOPIC + REFLEX CULTURE
BACTERIA UA: NONE SEEN [HPF]
Bilirubin Urine: NEGATIVE
CASTS: NONE SEEN [LPF]
Crystals: NONE SEEN [HPF]
GLUCOSE, UA: NEGATIVE
Hgb urine dipstick: NEGATIVE
Ketones, ur: NEGATIVE
NITRITE: NEGATIVE
PROTEIN: NEGATIVE
Specific Gravity, Urine: 1.006 (ref 1.001–1.035)
Squamous Epithelial / LPF: NONE SEEN [HPF] (ref <=5)
YEAST: NONE SEEN [HPF]
pH: 7 (ref 5.0–8.0)

## 2015-11-10 LAB — CYTOLOGY - PAP

## 2015-11-11 ENCOUNTER — Other Ambulatory Visit: Payer: Self-pay | Admitting: Women's Health

## 2015-11-11 LAB — URINE CULTURE

## 2015-11-11 MED ORDER — CIPROFLOXACIN HCL 250 MG PO TABS: 250.0000 mg | ORAL_TABLET | Freq: Two times a day (BID) | ORAL | Status: DC

## 2015-12-05 ENCOUNTER — Ambulatory Visit (INDEPENDENT_AMBULATORY_CARE_PROVIDER_SITE_OTHER): Payer: BLUE CROSS/BLUE SHIELD | Admitting: Family Medicine

## 2015-12-05 ENCOUNTER — Encounter: Payer: Self-pay | Admitting: Family Medicine

## 2015-12-05 VITALS — BP 118/70 | HR 78 | Temp 99.0°F | Ht 66.0 in | Wt 191.8 lb

## 2015-12-05 DIAGNOSIS — R5381 Other malaise: Secondary | ICD-10-CM | POA: Diagnosis not present

## 2015-12-05 DIAGNOSIS — R05 Cough: Secondary | ICD-10-CM | POA: Diagnosis not present

## 2015-12-05 DIAGNOSIS — R509 Fever, unspecified: Secondary | ICD-10-CM | POA: Diagnosis not present

## 2015-12-05 DIAGNOSIS — R059 Cough, unspecified: Secondary | ICD-10-CM

## 2015-12-05 DIAGNOSIS — R062 Wheezing: Secondary | ICD-10-CM

## 2015-12-05 LAB — POCT INFLUENZA A/B
INFLUENZA A, POC: NEGATIVE
Influenza B, POC: NEGATIVE

## 2015-12-05 MED ORDER — PREDNISONE 20 MG PO TABS: ORAL_TABLET | ORAL | Status: DC

## 2015-12-05 MED ORDER — HYDROCODONE-HOMATROPINE 5-1.5 MG/5ML PO SYRP: 5.0000 mL | ORAL_SOLUTION | Freq: Three times a day (TID) | ORAL | Status: DC | PRN

## 2015-12-05 MED ORDER — ALBUTEROL SULFATE HFA 108 (90 BASE) MCG/ACT IN AERS: 2.0000 | INHALATION_SPRAY | Freq: Four times a day (QID) | RESPIRATORY_TRACT | Status: DC | PRN

## 2015-12-05 MED ORDER — CEFDINIR 300 MG PO CAPS: 300.0000 mg | ORAL_CAPSULE | Freq: Two times a day (BID) | ORAL | Status: DC

## 2015-12-05 MED FILL — VENTOLIN HFA 90 MCG INHALER: 108 (90 BAS | 30 days supply | Qty: 18 | Fill #0

## 2015-12-05 MED FILL — CEFDINIR 300 MG CAPSULE: 300 | 10 days supply | Qty: 20 | Fill #0

## 2015-12-05 MED FILL — HYDROCODONE-HOMATROPINE SYR: 5-1.5 | 6 days supply | Qty: 90 | Fill #0

## 2015-12-05 MED FILL — predniSONE 20 MG TABS: 20 | 3 days supply | Qty: 6 | Fill #0

## 2015-12-05 NOTE — Progress Notes (Signed)
Pre visit review using our clinic review tool, if applicable. No additional management support is needed unless otherwise documented below in the visit note. 

## 2015-12-05 NOTE — Patient Instructions (Signed)
You are wheezing and probably have a viral illness Use the inhaler and the prednisone for cough and wheezing Cough syrup as needed- remember this will make you sleepy!  Do not use this when you need to drive  If you are not getting better in the next few days fill and use the antibiotic rx (omnicef) Let me know if you are not improving soon or if you are getting worse

## 2015-12-05 NOTE — Progress Notes (Signed)
[place] [facility] [address], [place], [place] [address] [phone]  Date:  12/05/2015   Name:  Paula Leblanc   DOB:  1961-06-17   MRN:  [id number]  PCP:  Kathlene November, MD    Chief Complaint: Cough   History of Present Illness:  Paula Leblanc is a 55 y.o. very pleasant female patient who presents with the following:  Here today with illness- she does have a history of asthma She has been exposed to illness at work and endorses a history of getting bronchitis easily.  She has noted sx of sinus pain and pressure, discolored mucus from her nose, and a cough for the last 2-3 days.   The cough can be productive.  She has had some body aches, has felt tired.   She took her temp last night and measured 99 or 100.   She does not have ny asthma meds.  She has noted a little wheezing with this.  She does not have an inhaler at home but would be willing to use one if needed.  Generally she likes to avoid the inhaler as it makes her feel shaky   Patient Active Problem List   Diagnosis Date Noted  . PCP NOTES >>> 07/16/2015  . Intrinsic asthma 04/15/2015  . Sinusitis 03/28/2015  . Nailbed injury 03/28/2015  . Insomnia 04/26/2011  . Annual physical exam 04/26/2011  . Fatigue 01/25/2011  . DJD -- aches, pains 08/05/2009  . BREAST CYST, LEFT 01/10/2007    Past Medical History  Diagnosis Date  . Intrinsic asthma 04/15/2015    Past Surgical History  Procedure Laterality Date  . Cholecystectomy    . Cataract extraction Bilateral     L 2004, R 2006    Social History  Substance Use Topics  . Smoking status: Never Smoker   . Smokeless tobacco: Never Used  . Alcohol Use: No    Family History  Problem Relation Age of Onset  . Colon cancer Father 59  . Diabetes Father   . Heart attack Father 41  . Hyperlipidemia Mother   . Osteoarthritis Mother   . Breast cancer Neg Hx   . Stroke Father 24    Allergies  Allergen  Reactions  . Erythromycin     REACTION: Confusion  . Sulfonamide Derivatives     Medication list has been reviewed and updated.  Current Outpatient Prescriptions on File Prior to Visit  Medication Sig Dispense Refill  . calcium carbonate (TUMS EX) 750 MG chewable tablet Chew 1 tablet by mouth daily.      . cyanocobalamin 100 MCG tablet Take 100 mcg by mouth daily.      . multivitamin (THERAGRAN) per tablet Take 1 tablet by mouth daily.       No current facility-administered medications on file prior to visit.    Review of Systems:  As per HPI- otherwise negative.   Physical Examination: Filed Vitals:   12/05/15 1104  BP: 118/70  Pulse: 78  Temp: 99 F (37.2 C)   Filed Vitals:   12/05/15 1104  Height: 5\' 6"  (1.676 m)  Weight: 191 lb 12.8 oz (87 kg)   Body mass index is 30.97 kg/(m^2). Ideal Body Weight: Weight in (lb) to have BMI = 25: 154.6  GEN: WDWN, NAD, Non-toxic, A & O x 3, overweight, looks well HEENT: Atraumatic, Normocephalic. Neck supple. No masses, No LAD.  Bilateral TM wnl, oropharynx normal.  PEERL,EOMI.   Ears and Nose: No external deformity. CV: RRR, No M/G/R. No JVD. No thrill. No extra heart sounds. PULM: diffuse mild wheezing bilaterally. No retractions. No resp. distress. No accessory muscle use. EXTR: No c/c/e NEURO Normal gait.  PSYCH: Normally interactive. Conversant. Not depressed or anxious appearing.  Calm demeanor.   Results for orders placed or performed in visit on 12/05/15  POCT Influenza A/B  Result Value Ref Range   Influenza A, POC Negative Negative   Influenza B, POC Negative Negative    Assessment and Plan: Cough - Plan: POCT Influenza A/B, cefdinir (OMNICEF) 300 MG capsule, HYDROcodone-homatropine (HYCODAN) 5-1.5 MG/5ML syrup  Malaise - Plan: POCT Influenza A/B  Low grade fever - Plan: POCT Influenza A/B  Wheezing - Plan: albuterol (PROVENTIL HFA;VENTOLIN HFA) 108 (90 Base) MCG/ACT inhaler, predniSONE (DELTASONE) 20 MG  tablet  Here today with illness- flu is negative She is wheezing and has a history of asthma.  Did not give a neb as she has a history of shakiness with albuterol.  Inhaler dose will be easier for her to tolerate Prednisone for 3 days, albuterol Did give her an inhaler to use if not better omnicef to hold and use if not better in the next few days- she will let me know if not improving soon  Signed Lamar Blinks, MD

## 2015-12-08 ENCOUNTER — Telehealth: Payer: Self-pay | Admitting: Family Medicine

## 2015-12-08 NOTE — Telephone Encounter (Signed)
Forwarded message to DOD Dr. Etter Sjogren. Requested advice on what she would like for patient to do. Start new medication or be placed on next available schedule.

## 2015-12-08 NOTE — Telephone Encounter (Signed)
Patient was seen on 12/04/25 by Dr.Copland, Please advise on patient's request.     KP

## 2015-12-08 NOTE — Telephone Encounter (Signed)
Can be reached: [phone] Pharmacy: [id number] - [place], [place]  Reason for call: pt states she was advised to call if not feeling better by today. Pt states her ears are getting worse and cough is getting worse. She has a low grade fever. She is wheezing (audible thru the phone).

## 2015-12-08 NOTE — Telephone Encounter (Signed)
I saw this pt 3 days ago for a cough.  She was treated with albuterol, and hycodan for wheezing.  Given omnicef to hold if not better in a few days. She finished the prednisone and started the antibiotic on the day that I saw her actually.    She had a temp up to 99.5.  Her wheezing is a bit better but she continues to feel "awful."  She will come and see [place] tomorrow for an acute visit but will go to the ER if worse in the meantime. appt 10:45 tomorrow am

## 2015-12-09 ENCOUNTER — Encounter: Payer: Self-pay | Admitting: Physician Assistant

## 2015-12-09 ENCOUNTER — Ambulatory Visit (INDEPENDENT_AMBULATORY_CARE_PROVIDER_SITE_OTHER): Payer: BLUE CROSS/BLUE SHIELD | Admitting: Physician Assistant

## 2015-12-09 ENCOUNTER — Ambulatory Visit (HOSPITAL_BASED_OUTPATIENT_CLINIC_OR_DEPARTMENT_OTHER)
Admission: RE | Admit: 2015-12-09 | Discharge: 2015-12-09 | Disposition: A | Discharge status: Home / Self Care | Payer: BLUE CROSS/BLUE SHIELD | Source: Ambulatory Visit | Attending: Physician Assistant | Admitting: Physician Assistant

## 2015-12-09 VITALS — BP 140/86 | HR 73 | Temp 98.1°F | Ht 66.0 in | Wt 194.8 lb

## 2015-12-09 DIAGNOSIS — J209 Acute bronchitis, unspecified: Secondary | ICD-10-CM | POA: Insufficient documentation

## 2015-12-09 DIAGNOSIS — J45901 Unspecified asthma with (acute) exacerbation: Secondary | ICD-10-CM | POA: Insufficient documentation

## 2015-12-09 DIAGNOSIS — R059 Cough, unspecified: Secondary | ICD-10-CM

## 2015-12-09 DIAGNOSIS — R05 Cough: Secondary | ICD-10-CM | POA: Diagnosis not present

## 2015-12-09 MED ORDER — LEVOFLOXACIN 500 MG PO TABS: 500.0000 mg | ORAL_TABLET | Freq: Every day | ORAL | Status: DC

## 2015-12-09 MED ORDER — PREDNISONE 10 MG PO TABS: ORAL_TABLET | ORAL | Status: DC

## 2015-12-09 MED ORDER — HYDROCODONE-HOMATROPINE 5-1.5 MG/5ML PO SYRP: 5.0000 mL | ORAL_SOLUTION | Freq: Three times a day (TID) | ORAL | Status: DC | PRN

## 2015-12-09 MED FILL — levoFLOXacin 500 MG TABS: 500 | 7 days supply | Qty: 7 | Fill #0

## 2015-12-09 MED FILL — predniSONE 10 MG TABS: 10 | 12 days supply | Qty: 30 | Fill #0

## 2015-12-09 NOTE — Patient Instructions (Signed)
STop Cefdinir. Take antibiotic (Levaquin) as directed.  Increase fluids.  Get plenty of rest. Use Mucinex for congestion. Take prednisone as directed. Take a daily probiotic (I recommend Align or Culturelle, but even Activia Yogurt may be beneficial).  A humidifier placed in the bedroom may offer some relief for a dry, scratchy throat of nasal irritation.  Read information below on acute bronchitis. Please call or return to clinic if symptoms are not improving.  Acute Bronchitis Bronchitis is when the airways that extend from the windpipe into the lungs get red, puffy, and painful (inflamed). Bronchitis often causes thick spit (mucus) to develop. This leads to a cough. A cough is the most common symptom of bronchitis. In acute bronchitis, the condition usually begins suddenly and goes away over time (usually in 2 weeks). Smoking, allergies, and asthma can make bronchitis worse. Repeated episodes of bronchitis may cause more lung problems.  HOME CARE  Rest.  Drink enough fluids to keep your pee (urine) clear or pale yellow (unless you need to limit fluids as told by your doctor).  Only take over-the-counter or prescription medicines as told by your doctor.  Avoid smoking and secondhand smoke. These can make bronchitis worse. If you are a smoker, think about using nicotine gum or skin patches. Quitting smoking will help your lungs heal faster.  Reduce the chance of getting bronchitis again by:  Washing your hands often.  Avoiding people with cold symptoms.  Trying not to touch your hands to your mouth, nose, or eyes.  Follow up with your doctor as told.  GET HELP IF: Your symptoms do not improve after 1 week of treatment. Symptoms include:  Cough.  Fever.  Coughing up thick spit.  Body aches.  Chest congestion.  Chills.  Shortness of breath.  Sore throat.  GET HELP RIGHT AWAY IF:   You have an increased fever.  You have chills.  You have severe shortness of  breath.  You have bloody thick spit (sputum).  You throw up (vomit) often.  You lose too much body fluid (dehydration).  You have a severe headache.  You faint.  MAKE SURE YOU:   Understand these instructions.  Will watch your condition.  Will get help right away if you are not doing well or get worse. Document Released: 03/05/2008 Document Revised: 05/20/2013 Document Reviewed: 03/10/2013 [facility] Patient Information 2015 [place], [place]. This information is not intended to replace advice given to you by your health care provider. Make sure you discuss any questions you have with your health care provider.

## 2015-12-09 NOTE — Progress Notes (Signed)
Patient presents to clinic today for continued cough and chest congestion with wheezing tightness. Was seen earlier in the week and started on Cefdinir 300 mg BID. Was also placed on 40 mg prednisone for 3 days. Endorses taking medications as directed with only mild improvement in symptoms. Denies fever, chills, chest pain or SOB. Has not used albuterol in 2 days.  Past Medical History  Diagnosis Date  . Intrinsic asthma 04/15/2015    Current Outpatient Prescriptions on File Prior to Visit  Medication Sig Dispense Refill  . albuterol (PROVENTIL HFA;VENTOLIN HFA) 108 (90 Base) MCG/ACT inhaler Inhale 2 puffs into the lungs every 6 (six) hours as needed for wheezing or shortness of breath. 1 Inhaler 0  . calcium carbonate (TUMS EX) 750 MG chewable tablet Chew 1 tablet by mouth daily.      . cyanocobalamin 100 MCG tablet Take 100 mcg by mouth daily.      . multivitamin (THERAGRAN) per tablet Take 1 tablet by mouth daily.       No current facility-administered medications on file prior to visit.    Allergies  Allergen Reactions  . Erythromycin     REACTION: Confusion  . Sulfonamide Derivatives     Family History  Problem Relation Age of Onset  . Colon cancer Father 23  . Diabetes Father   . Heart attack Father 38  . Hyperlipidemia Mother   . Osteoarthritis Mother   . Breast cancer Neg Hx   . Stroke Father 51    Social History   Social History  . Marital Status: Married    Spouse Name: N/A  . Number of Children: 1  . Years of Education: N/A   Occupational History  . office manager, law firm    Social History Main Topics  . Smoking status: Never Smoker   . Smokeless tobacco: Never Used  . Alcohol Use: No  . Drug Use: No  . Sexual Activity: Not Asked   Other Topics Concern  . None   Social History Narrative   Married w/ Almond Lint, daughter in law of [place] [employer] - See HPI.  All other ROS are negative.  BP 140/86 mmHg  Pulse 73   Temp(Src) 98.1 F (36.7 C) (Oral)  Ht 5\' 6"  (1.676 m)  Wt 194 lb 12.8 oz (88.361 kg)  BMI 31.46 kg/m2  SpO2 97%  Physical Exam  Constitutional: She is oriented to person, place, and time and well-developed, well-nourished, and in no distress.  HENT:  Head: Normocephalic and atraumatic.  Right Ear: External ear normal.  Left Ear: External ear normal.  Nose: Nose normal.  Mouth/Throat: Oropharynx is clear and moist. No oropharyngeal exudate.  TM within normal limits bilaterally.  Eyes: Conjunctivae are normal.  Neck: Neck supple.  Cardiovascular: Normal rate, regular rhythm, normal heart sounds and intact distal pulses.   Pulmonary/Chest: Effort normal. No respiratory distress. She has wheezes. She has no rales. She exhibits no tenderness.  Neurological: She is alert and oriented to person, place, and time.  Skin: Skin is warm and dry. No rash noted.  Psychiatric: Affect normal.  Vitals reviewed.   Recent Results (from the past 2160 hour(s))  Cytology - PAP     Status: None   Collection Time: 11/08/15 12:00 AM  Result Value Ref Range   CYTOLOGY - PAP PAP RESULT   Urinalysis with Culture Reflex     Status: Abnormal   Collection Time:  11/08/15  2:16 PM  Result Value Ref Range   Color, Urine YELLOW YELLOW    Comment: ** Please note change in unit of measure and reference range(s). **      APPearance CLEAR CLEAR   Specific Gravity, Urine 1.006 1.001 - 1.035   pH 7.0 5.0 - 8.0   Glucose, UA NEGATIVE NEGATIVE   Bilirubin Urine NEGATIVE NEGATIVE   Ketones, ur NEGATIVE NEGATIVE   Hgb urine dipstick NEGATIVE NEGATIVE   Protein, ur NEGATIVE NEGATIVE   Nitrite NEGATIVE NEGATIVE   Leukocytes, UA 1+ (A) NEGATIVE   WBC, UA 0-5 <=5 WBC/HPF   RBC / HPF 0-2 <=2 RBC/HPF   Squamous Epithelial / LPF NONE SEEN <=5 HPF   Bacteria, UA NONE SEEN NONE SEEN HPF   Crystals NONE SEEN NONE SEEN HPF   Casts NONE SEEN NONE SEEN LPF   Yeast NONE SEEN NONE SEEN HPF  Urine culture     Status: None    Collection Time: 11/08/15  2:16 PM  Result Value Ref Range   Colony Count >=100,000 COLONIES/ML    Organism ID, Bacteria ENTEROCOCCUS SPECIES       Susceptibility   Enterococcus species -  (no method available)    AMPICILLIN <=2 Sensitive     LEVOFLOXACIN 1 Sensitive     NITROFURANTOIN <=16 Sensitive     VANCOMYCIN 1 Sensitive     TETRACYCLINE >=16 Resistant   POCT Influenza A/B     Status: Normal   Collection Time: 12/05/15 11:37 AM  Result Value Ref Range   Influenza A, POC Negative Negative   Influenza B, POC Negative Negative    Assessment/Plan: Acute bronchitis with asthma with acute exacerbation Duoneb x 1 given. CXR obtained today and negative for pneumonia. Will stop Cefdinir and begin Levaquin 500 mg once daily. Continue cough medication. Will begin steroid taper. Will follow-up early next week. ER if anything worsens.

## 2015-12-09 NOTE — Progress Notes (Signed)
Pre visit review using our clinic review tool, if applicable. No additional management support is needed unless otherwise documented below in the visit note. 

## 2015-12-09 NOTE — Assessment & Plan Note (Signed)
Duoneb x 1 given. CXR obtained today and negative for pneumonia. Will stop Cefdinir and begin Levaquin 500 mg once daily. Continue cough medication. Will begin steroid taper. Will follow-up early next week. ER if anything worsens.

## 2016-01-31 ENCOUNTER — Encounter: Payer: Self-pay | Admitting: Internal Medicine

## 2016-01-31 ENCOUNTER — Ambulatory Visit (INDEPENDENT_AMBULATORY_CARE_PROVIDER_SITE_OTHER): Payer: BLUE CROSS/BLUE SHIELD | Admitting: Internal Medicine

## 2016-01-31 VITALS — BP 110/70 | HR 80 | Temp 97.8°F | Ht 66.0 in | Wt 196.8 lb

## 2016-01-31 DIAGNOSIS — M25552 Pain in left hip: Secondary | ICD-10-CM | POA: Diagnosis not present

## 2016-01-31 DIAGNOSIS — Z09 Encounter for follow-up examination after completed treatment for conditions other than malignant neoplasm: Secondary | ICD-10-CM

## 2016-01-31 NOTE — Progress Notes (Signed)
Pre visit review using our clinic review tool, if applicable. No additional management support is needed unless otherwise documented below in the visit note. 

## 2016-01-31 NOTE — Patient Instructions (Signed)
IBUPROFEN (Advil or Motrin) 200 mg  3  tablets every 8 hours as needed for pain.  Always take it with food because may cause gastritis and ulcers.  If you notice nausea, stomach pain, change in the color of stools --->  Stop the medicine and let us know   rest for 3 days, then go back  gradually to your normal activities  If you are not back to normal in the next 2 or 3 weeks please call the office for a referral

## 2016-01-31 NOTE — Progress Notes (Signed)
   Subjective:    Patient ID: Paula Leblanc, female    DOB: 1961/03/15, 55 y.o.   MRN: [id number]  DOS:  01/31/2016 Type of visit - description : Acute visit Interval history: About 3 weeks ago she "kick" a 5 gallon water  container with the left leg trying to move it, since then is having pain at the left groin mostly when she moves or tries to use the leg. Has taken occasional Motrin with mild help.   Review of Systems  denies a mass in the groin Denies lower extremity paresthesias Has back pain, at baseline  Past Medical History  Diagnosis Date  . Intrinsic asthma 04/15/2015    Past Surgical History  Procedure Laterality Date  . Cholecystectomy    . Cataract extraction Bilateral     L 2004, R 2006    Social History   Social History  . Marital Status: Married    Spouse Name: N/A  . Number of Children: 1  . Years of Education: N/A   Occupational History  . office manager, law firm    Social History Main Topics  . Smoking status: Never Smoker   . Smokeless tobacco: Never Used  . Alcohol Use: No  . Drug Use: No  . Sexual Activity: Not on file   Other Topics Concern  . Not on file   Social History Narrative   Married w/ Kaidyn Mietus, daughter in law of [employer]               Medication List       This list is accurate as of: 01/31/16 11:59 PM.  Always use your most recent med list.               albuterol 108 (90 Base) MCG/ACT inhaler  Commonly known as:  PROVENTIL HFA;VENTOLIN HFA  Inhale 2 puffs into the lungs every 6 (six) hours as needed for wheezing or shortness of breath.     calcium carbonate 750 MG chewable tablet  Commonly known as:  TUMS EX  Chew 1 tablet by mouth daily.     cyanocobalamin 100 MCG tablet  Take 100 mcg by mouth daily.     multivitamin per tablet  Take 1 tablet by mouth daily.           Objective:   Physical Exam BP 110/70 mmHg  Pulse 80  Temp(Src) 97.8 F (36.6 C) (Oral)  Ht 5\' 6"  (1.676 m)  Wt 196 lb 12.8 oz  (89.268 kg)  BMI 31.78 kg/m2  SpO2 97% General:   Well developed, well nourished . NAD.  HEENT:  Normocephalic . Face symmetric, atraumatic  Abdomen: No inguinal hernia on either side MSK: Right hip: Full range of motion, minimal pain with range of motion Left hip: Full range of motion, moderate pain with rotation. Skin: Not pale. Not jaundice Neurologic:  alert & oriented X3.  Speech normal, gait appropriate for age and unassisted Psych--  Cognition and judgment appear intact.  Cooperative with normal attention span and concentration.  Behavior appropriate. No anxious or depressed appearing.      Assessment & Plan:   Assessment > Asthma DJD, hands, saw ortho before, had XRs Menopause -- onset ~ 56 y/o  Plan: Hip sprain: Recommend rest, increased dose of Motrin, gradual return to normal activities, call if not improving.

## 2016-02-01 NOTE — Assessment & Plan Note (Signed)
Hip sprain: Recommend rest, increased dose of Motrin, gradual return to normal activities, call if not improving.

## 2016-06-06 DIAGNOSIS — M19041 Primary osteoarthritis, right hand: Secondary | ICD-10-CM | POA: Diagnosis not present

## 2016-06-06 DIAGNOSIS — G5603 Carpal tunnel syndrome, bilateral upper limbs: Secondary | ICD-10-CM | POA: Insufficient documentation

## 2016-06-06 DIAGNOSIS — M79642 Pain in left hand: Secondary | ICD-10-CM | POA: Diagnosis not present

## 2016-06-06 DIAGNOSIS — M19042 Primary osteoarthritis, left hand: Secondary | ICD-10-CM | POA: Diagnosis not present

## 2016-06-06 DIAGNOSIS — M79641 Pain in right hand: Secondary | ICD-10-CM | POA: Diagnosis not present

## 2016-06-06 DIAGNOSIS — R52 Pain, unspecified: Secondary | ICD-10-CM | POA: Diagnosis not present

## 2016-06-12 DIAGNOSIS — G5603 Carpal tunnel syndrome, bilateral upper limbs: Secondary | ICD-10-CM | POA: Diagnosis not present

## 2016-06-13 DIAGNOSIS — M19042 Primary osteoarthritis, left hand: Secondary | ICD-10-CM | POA: Diagnosis not present

## 2016-06-13 DIAGNOSIS — G5603 Carpal tunnel syndrome, bilateral upper limbs: Secondary | ICD-10-CM | POA: Diagnosis not present

## 2016-06-13 DIAGNOSIS — M19041 Primary osteoarthritis, right hand: Secondary | ICD-10-CM | POA: Diagnosis not present

## 2016-06-15 DIAGNOSIS — Z23 Encounter for immunization: Secondary | ICD-10-CM | POA: Diagnosis not present

## 2016-07-04 DIAGNOSIS — M19041 Primary osteoarthritis, right hand: Secondary | ICD-10-CM | POA: Diagnosis not present

## 2016-07-04 DIAGNOSIS — G5603 Carpal tunnel syndrome, bilateral upper limbs: Secondary | ICD-10-CM | POA: Diagnosis not present

## 2016-07-04 DIAGNOSIS — M19042 Primary osteoarthritis, left hand: Secondary | ICD-10-CM | POA: Diagnosis not present

## 2016-07-20 ENCOUNTER — Encounter: Payer: Self-pay | Admitting: Internal Medicine

## 2016-07-20 ENCOUNTER — Ambulatory Visit (INDEPENDENT_AMBULATORY_CARE_PROVIDER_SITE_OTHER): Payer: BLUE CROSS/BLUE SHIELD | Admitting: Internal Medicine

## 2016-07-20 VITALS — BP 118/68 | HR 68 | Temp 97.8°F | Resp 14 | Ht 66.0 in | Wt 192.4 lb

## 2016-07-20 DIAGNOSIS — Z23 Encounter for immunization: Secondary | ICD-10-CM | POA: Diagnosis not present

## 2016-07-20 DIAGNOSIS — Z0000 Encounter for general adult medical examination without abnormal findings: Secondary | ICD-10-CM

## 2016-07-20 LAB — CBC WITH DIFFERENTIAL/PLATELET
BASOS PCT: 0.7 % (ref 0.0–3.0)
Basophils Absolute: 0.1 10*3/uL (ref 0.0–0.1)
EOS ABS: 0.2 10*3/uL (ref 0.0–0.7)
Eosinophils Relative: 2.6 % (ref 0.0–5.0)
HEMATOCRIT: 42.8 % (ref 36.0–46.0)
HEMOGLOBIN: 14.5 g/dL (ref 12.0–15.0)
LYMPHS PCT: 29 % (ref 12.0–46.0)
Lymphs Abs: 2.3 10*3/uL (ref 0.7–4.0)
MCHC: 34 g/dL (ref 30.0–36.0)
MCV: 87.9 fl (ref 78.0–100.0)
MONO ABS: 0.5 10*3/uL (ref 0.1–1.0)
Monocytes Relative: 6.6 % (ref 3.0–12.0)
Neutro Abs: 4.9 10*3/uL (ref 1.4–7.7)
Neutrophils Relative %: 61.1 % (ref 43.0–77.0)
Platelets: 251 10*3/uL (ref 150.0–400.0)
RBC: 4.86 Mil/uL (ref 3.87–5.11)
RDW: 13.4 % (ref 11.5–15.5)
WBC: 8 10*3/uL (ref 4.0–10.5)

## 2016-07-20 LAB — LIPID PANEL
CHOLESTEROL: 225 mg/dL — AB (ref 0–200)
HDL: 39.4 mg/dL (ref >39.00)
NonHDL: 186.06
Total CHOL/HDL Ratio: 6
Triglycerides: 293 mg/dL — ABNORMAL HIGH (ref 0.0–149.0)
VLDL: 58.6 mg/dL — AB (ref 0.0–40.0)

## 2016-07-20 LAB — COMPREHENSIVE METABOLIC PANEL
ALBUMIN: 4.1 g/dL (ref 3.5–5.2)
ALK PHOS: 70 U/L (ref 39–117)
ALT: 24 U/L (ref 0–35)
AST: 21 U/L (ref 0–37)
BUN: 17 mg/dL (ref 6–23)
CALCIUM: 9.6 mg/dL (ref 8.4–10.5)
CHLORIDE: 110 meq/L (ref 96–112)
CO2: 34 mEq/L — ABNORMAL HIGH (ref 19–32)
CREATININE: 0.7 mg/dL (ref 0.40–1.20)
GFR: 91.46 mL/min (ref >60.00)
Glucose, Bld: 97 mg/dL (ref 70–99)
POTASSIUM: 4.8 meq/L (ref 3.5–5.1)
Sodium: 148 mEq/L — ABNORMAL HIGH (ref 135–145)
TOTAL PROTEIN: 6.9 g/dL (ref 6.0–8.3)
Total Bilirubin: 0.5 mg/dL (ref 0.2–1.2)

## 2016-07-20 LAB — LDL CHOLESTEROL, DIRECT: LDL DIRECT: 130 mg/dL

## 2016-07-20 NOTE — Assessment & Plan Note (Signed)
Plan: Asthma- controlled, reports rarely needs albuterol Skin lesions: One has gotten bigger and the other has change in color, needs to see dermatology, states she will call. Encouraged to do. Heel pain, likely plantar fasciitis, stretching discussed RTC one year

## 2016-07-20 NOTE — Progress Notes (Signed)
Pre visit review using our clinic review tool, if applicable. No additional management support is needed unless otherwise documented below in the visit note. 

## 2016-07-20 NOTE — Assessment & Plan Note (Signed)
Td [id number]; pnm shot 2017, prevnar--07-2016 Had a flu shot   Female care per gyn, last OV 11-2015, had a PAP per pt  MMG-- 10-2015 (-)  DEXA, never done , had a late menopause (age ~ 78)  cscope 11-2010 , tics, Dr Carlean Purl, pt plans to call   Labs: CMP, FLP, CBC Diet and exercise -- counseled On calcium and vitamin D.

## 2016-07-20 NOTE — Progress Notes (Signed)
Subjective:    Patient ID: Paula Leblanc, female    DOB: [birth year], 55 y.o.   MRN: [id number]  DOS:  07/20/2016 Type of visit - description : cpx Interval history: has few concerns. See ROS    Review of Systems  Constitutional: No fever. No chills. No unexplained wt changes. No unusual sweats  HEENT: No dental problems, no ear discharge, no facial swelling, no voice changes. No eye discharge, no eye  redness , no  intolerance to light   Respiratory: No wheezing , no  difficulty breathing. No cough , no mucus production  Cardiovascular: No CP, no leg swelling , no  Palpitations  GI: no nausea, no vomiting, no diarrhea , no  abdominal pain.  Occ. red blood per rectum, not a new issue (hemorrhoids)  Plans to call her GI for a colonoscopy which she is due for. No dysphagia, no odynophagia    Endocrine: No polyphagia, no polyuria , no polydipsia  GU: No dysuria, gross hematuria, difficulty urinating. No urinary urgency, no frequency.  Musculoskeletal: Occasional bilateral heel pain, plantar area. On and off  Skin: No change in the color of the skin, palor , no  Rash. A couple of lesions hasn't change.  Allergic, immunologic: No environmental allergies , no  food allergies  Neurological: No dizziness no  syncope. No headaches. No diplopia, no slurred, no slurred speech, no motor deficits, no facial  Numbness  Hematological: No enlarged lymph nodes, no easy bruising , no unusual bleedings  Psychiatry: No suicidal ideas, no hallucinations, no beavior problems, no confusion.  No unusual/severe anxiety, no depression   Past Medical History:  Diagnosis Date  . Carpal tunnel syndrome, bilateral   . Intrinsic asthma 04/15/2015  . Osteoarthritis of both hands     Past Surgical History:  Procedure Laterality Date  . CATARACT EXTRACTION Bilateral    L 2004, R 2006  . CHOLECYSTECTOMY      Social History   Social History  . Marital status: Married    Spouse name: N/A  .  Number of children: 1  . Years of education: N/A   Occupational History  . office manager, law firm    Social History Main Topics  . Smoking status: Never Smoker  . Smokeless tobacco: Never Used  . Alcohol use No  . Drug use: No  . Sexual activity: Not on file   Other Topics Concern  . Not on file   Social History Narrative   Married w/ Almond Lint, daughter in law of [employer]    Daughter works in [place]     Family History  Problem Relation Age of Onset  . Colon cancer Father 38  . Diabetes Father   . Heart attack Father 6  . Stroke Father 84  . Hyperlipidemia Mother   . Osteoarthritis Mother   . Melanoma Mother   . Breast cancer Neg Hx        Medication List       Accurate as of 07/20/16  1:53 PM. Always use your most recent med list.          albuterol 108 (90 Base) MCG/ACT inhaler Commonly known as:  PROVENTIL HFA;VENTOLIN HFA Inhale 2 puffs into the lungs every 6 (six) hours as needed for wheezing or shortness of breath.   calcium carbonate 750 MG chewable tablet Commonly known as:  TUMS EX Chew 1 tablet by mouth daily.   cyanocobalamin 100 MCG tablet Take 100 mcg by mouth daily.  multivitamin per tablet Take 1 tablet by mouth daily.          Objective:   Physical Exam BP 118/68 (BP Location: Left Arm, Patient Position: Sitting, Cuff Size: Normal)   Pulse 68   Temp 97.8 F (36.6 C) (Oral)   Resp 14   Ht 5\' 6"  (1.676 m)   Wt 192 lb 6 oz (87.3 kg)   SpO2 98%   BMI 31.05 kg/m   General:   Well developed, well nourished . NAD.  Neck: No  thyromegaly  HEENT:  Normocephalic . Face symmetric, atraumatic Lungs:  CTA B Normal respiratory effort, no intercostal retractions, no accessory muscle use. Heart: RRR,  no murmur.  No pretibial edema bilaterally  Abdomen:  Not distended, soft, non-tender. No rebound or rigidity.   Skin:  Left armpit Has a irregular shape red wine vascular lesion. ~ 11.5 cm. Right arm.: Has a skin tag, is bi-  color, the dark. area is new according to the patient Neurologic:  alert & oriented X3.  Speech normal, gait appropriate for age and unassisted Strength symmetric and appropriate for age.  Psych: Cognition and judgment appear intact.  Cooperative with normal attention span and concentration.  Behavior appropriate. No anxious or depressed appearing.    Assessment & Plan:   Assessment > Asthma DJD, hands, saw ortho before, had XRs Menopause -- onset ~ 55 y/o  Plan: Asthma- controlled, reports rarely needs albuterol Skin lesions: One has gotten bigger and the other has change in color, needs to see dermatology, states she will call. Encouraged to do. Heel pain, likely plantar fasciitis, stretching discussed RTC one year

## 2016-07-20 NOTE — Patient Instructions (Signed)
GO TO THE LAB : Get the blood work     GO TO THE FRONT DESK Schedule your next appointment for a complete physical exam in one year   Call Dr. Carlean Purl office for a colonoscopy  Please see your dermatologist  Call this office if you need a referral

## 2016-07-24 ENCOUNTER — Encounter: Payer: Self-pay | Admitting: Internal Medicine

## 2016-07-31 DIAGNOSIS — D2239 Melanocytic nevi of other parts of face: Secondary | ICD-10-CM | POA: Diagnosis not present

## 2016-07-31 DIAGNOSIS — L718 Other rosacea: Secondary | ICD-10-CM | POA: Diagnosis not present

## 2016-07-31 DIAGNOSIS — D2262 Melanocytic nevi of left upper limb, including shoulder: Secondary | ICD-10-CM | POA: Diagnosis not present

## 2016-07-31 DIAGNOSIS — D2261 Melanocytic nevi of right upper limb, including shoulder: Secondary | ICD-10-CM | POA: Diagnosis not present

## 2016-09-03 ENCOUNTER — Ambulatory Visit (AMBULATORY_SURGERY_CENTER): Payer: Self-pay

## 2016-09-03 ENCOUNTER — Encounter: Payer: Self-pay | Admitting: Internal Medicine

## 2016-09-03 VITALS — Ht 66.0 in | Wt 195.0 lb

## 2016-09-03 DIAGNOSIS — Z800 Family history of malignant neoplasm of digestive organs: Secondary | ICD-10-CM

## 2016-09-03 NOTE — Progress Notes (Signed)
No allergies to eggs or soy No diet meds No home oxygen No past problems with anesthesia except PONV  Declined emmi 

## 2016-09-12 ENCOUNTER — Encounter: Payer: Self-pay | Admitting: Internal Medicine

## 2016-09-12 ENCOUNTER — Ambulatory Visit (AMBULATORY_SURGERY_CENTER): Payer: BLUE CROSS/BLUE SHIELD | Admitting: Internal Medicine

## 2016-09-12 VITALS — BP 125/73 | HR 68 | Temp 97.1°F | Resp 18 | Ht 66.0 in | Wt 195.0 lb

## 2016-09-12 DIAGNOSIS — Z1212 Encounter for screening for malignant neoplasm of rectum: Secondary | ICD-10-CM | POA: Diagnosis not present

## 2016-09-12 DIAGNOSIS — Z800 Family history of malignant neoplasm of digestive organs: Secondary | ICD-10-CM

## 2016-09-12 DIAGNOSIS — D125 Benign neoplasm of sigmoid colon: Secondary | ICD-10-CM | POA: Diagnosis not present

## 2016-09-12 DIAGNOSIS — K635 Polyp of colon: Secondary | ICD-10-CM | POA: Diagnosis not present

## 2016-09-12 DIAGNOSIS — Z1211 Encounter for screening for malignant neoplasm of colon: Secondary | ICD-10-CM

## 2016-09-12 DIAGNOSIS — D123 Benign neoplasm of transverse colon: Secondary | ICD-10-CM

## 2016-09-12 MED ORDER — SODIUM CHLORIDE 0.9 % IV SOLN: 500.0000 mL | INTRAVENOUS | Status: DC

## 2016-09-12 NOTE — Progress Notes (Signed)
Called to room to assist during endoscopic procedure.  Patient ID and intended procedure confirmed with present staff. Received instructions for my participation in the procedure from the performing physician.  

## 2016-09-12 NOTE — Patient Instructions (Addendum)
I found and removed 2 small polyps that look benign. I will let you know pathology results and when to have another routine colonoscopy by mail.  You also have a condition called diverticulosis - common and not usually a problem. Please read the handout provided. Hemorrhoids also  I appreciate the opportunity to care for you. Gatha Mayer, MD, [facility]   Handouts given on colon polyps,diverticulosis,hemorrhoids. Call us with any questions or concerns. Thank you!   YOU HAD AN ENDOSCOPIC PROCEDURE TODAY AT [facility]:   Refer to the procedure report that was given to you for any specific questions about what was found during the examination.  If the procedure report does not answer your questions, please call your gastroenterologist to clarify.  If you requested that your care partner not be given the details of your procedure findings, then the procedure report has been included in a sealed envelope for you to review at your convenience later.  YOU SHOULD EXPECT: Some feelings of bloating in the abdomen. Passage of more gas than usual.  Walking can help get rid of the air that was put into your GI tract during the procedure and reduce the bloating. If you had a lower endoscopy (such as a colonoscopy or flexible sigmoidoscopy) you may notice spotting of blood in your stool or on the toilet paper. If you underwent a bowel prep for your procedure, you may not have a normal bowel movement for a few days.  Please Note:  You might notice some irritation and congestion in your nose or some drainage.  This is from the oxygen used during your procedure.  There is no need for concern and it should clear up in a day or so.  SYMPTOMS TO REPORT IMMEDIATELY:   Following lower endoscopy (colonoscopy or flexible sigmoidoscopy):  Excessive amounts of blood in the stool  Significant tenderness or worsening of abdominal pains  Swelling of the abdomen that is new, acute  Fever of  100F or higher  For urgent or emergent issues, a gastroenterologist can be reached at any hour by calling [phone].   DIET:  We do recommend a small meal at first, but then you may proceed to your regular diet.  Drink plenty of fluids but you should avoid alcoholic beverages for 24 hours.  ACTIVITY:  You should plan to take it easy for the rest of today and you should NOT DRIVE or use heavy machinery until tomorrow (because of the sedation medicines used during the test).    FOLLOW UP: Our staff will call the number listed on your records the next business day following your procedure to check on you and address any questions or concerns that you may have regarding the information given to you following your procedure. If we do not reach you, we will leave a message.  However, if you are feeling well and you are not experiencing any problems, there is no need to return our call.  We will assume that you have returned to your regular daily activities without incident.  If any biopsies were taken you will be contacted by phone or by letter within the next 1-3 weeks.  Please call us at [phone] if you have not heard about the biopsies in 3 weeks.    SIGNATURES/CONFIDENTIALITY: You and/or your care partner have signed paperwork which will be entered into your electronic medical record.  These signatures attest to the fact that that the information above on  your After Visit Summary has been reviewed and is understood.  Full responsibility of the confidentiality of this discharge information lies with you and/or your care-partner. 

## 2016-09-12 NOTE — Op Note (Signed)
[facility] Patient Name: Paula Leblanc Procedure Date: 09/12/2016 2:56 PM MRN: [id number] Endoscopist: Gatha Mayer , MD Age: 55 Referring MD:  Date of Birth: 1961-03-17 Gender: Female Account #: [id number] Procedure:                Colonoscopy Indications:              Screening patient at increased risk: Family history                            of 1st-degree relative with colorectal cancer at                            age 73 years (or older) Medicines:                Propofol per Anesthesia, Monitored Anesthesia Care Procedure:                Pre-Anesthesia Assessment:                           - Prior to the procedure, a History and Physical                            was performed, and patient medications and                            allergies were reviewed. The patient's tolerance of                            previous anesthesia was also reviewed. The risks                            and benefits of the procedure and the sedation                            options and risks were discussed with the patient.                            All questions were answered, and informed consent                            was obtained. Prior Anticoagulants: The patient has                            taken no previous anticoagulant or antiplatelet                            agents. ASA Grade Assessment: II - A patient with                            mild systemic disease. After reviewing the risks                            and benefits, the patient was deemed in  satisfactory condition to undergo the procedure.                           After obtaining informed consent, the colonoscope                            was passed under direct vision. Throughout the                            procedure, the patient's blood pressure, pulse, and                            oxygen saturations were monitored continuously. The                            Model  PCF-[id number]) scope was introduced                            through the anus and advanced to the the cecum,                            identified by appendiceal orifice and ileocecal                            valve. The colonoscopy was performed without                            difficulty. The patient tolerated the procedure                            well. The quality of the bowel preparation was                            good. The bowel preparation used was Miralax. The                            ileocecal valve, appendiceal orifice, and rectum                            were photographed. Scope In: 3:10:07 PM Scope Out: 3:38:04 PM Scope Withdrawal Time: 0 hours 22 minutes 53 seconds  Total Procedure Duration: 0 hours 27 minutes 57 seconds  Findings:                 The perianal and digital rectal examinations were                            normal.                           A diminutive polyp was found in the transverse                            colon. The polyp was sessile. The polyp was removed  with a cold biopsy forceps. Resection and retrieval                            were complete. Verification of patient                            identification for the specimen was done. Estimated                            blood loss was minimal.                           A 3 mm polyp was found in the sigmoid colon. The                            polyp was pedunculated. The polyp was removed with                            a cold snare. Resection and retrieval were                            complete. Verification of patient identification                            for the specimen was done. Estimated blood loss was                            minimal.                           Multiple small and large-mouthed diverticula were                            found in the sigmoid colon.                           Internal hemorrhoids were found during  retroflexion. Complications:            No immediate complications. Estimated Blood Loss:     Estimated blood loss was minimal. Impression:               - One diminutive polyp in the transverse colon,                            removed with a cold biopsy forceps. Resected and                            retrieved.                           - One 3 mm polyp in the sigmoid colon, removed with                            a cold snare. Resected and retrieved.                           -  Diverticulosis in the sigmoid colon.                           - Internal hemorrhoids. Recommendation:           - Patient has a contact number available for                            emergencies. The signs and symptoms of potential                            delayed complications were discussed with the                            patient. Return to normal activities tomorrow.                            Written discharge instructions were provided to the                            patient.                           - Resume previous diet.                           - Continue present medications.                           - Repeat colonoscopy is recommended. The                            colonoscopy date will be determined after pathology                            results from today's exam become available for                            review. Gatha Mayer, MD 09/12/2016 3:47:56 PM This report has been signed electronically.

## 2016-09-12 NOTE — Progress Notes (Signed)
Report to PACU, RN, vss, BBS= Clear.  

## 2016-09-13 ENCOUNTER — Telehealth: Payer: Self-pay

## 2016-09-13 ENCOUNTER — Telehealth: Payer: Self-pay | Admitting: *Deleted

## 2016-09-13 NOTE — Telephone Encounter (Signed)
  Follow up Call-  Call back number 09/12/2016  Post procedure Call Back phone  # [phone] cell  Permission to leave phone message Yes  Some recent data might be hidden    Patient was called for follow up after her procedure on 09/12/2016. I spoke with Paula Leblanc's husband and he reports that Paula Leblanc has returned to her normal daily activities without any complications.

## 2016-09-13 NOTE — Telephone Encounter (Signed)
  Follow up Call-  Call back number 09/12/2016  Post procedure Call Back phone  # [phone] cell  Permission to leave phone message Yes  Some recent data might be hidden     Patient questions:  Message left to call us if necessary.

## 2016-09-26 ENCOUNTER — Encounter: Payer: Self-pay | Admitting: Internal Medicine

## 2016-09-26 DIAGNOSIS — Z860101 Personal history of adenomatous and serrated colon polyps: Secondary | ICD-10-CM | POA: Insufficient documentation

## 2016-09-26 DIAGNOSIS — Z86010 Personal history of colonic polyps: Secondary | ICD-10-CM

## 2016-09-26 HISTORY — DX: Personal history of colonic polyps: Z86.010

## 2016-09-26 HISTORY — DX: Personal history of adenomatous and serrated colon polyps: Z86.0101

## 2016-09-26 NOTE — Progress Notes (Signed)
Diminutive adenoma + diminutive hyperplastic polyp (distal) Colon recall 08/2021  My Chart notice no letter

## 2016-10-29 DIAGNOSIS — M5136 Other intervertebral disc degeneration, lumbar region: Secondary | ICD-10-CM | POA: Diagnosis not present

## 2016-10-29 DIAGNOSIS — M5416 Radiculopathy, lumbar region: Secondary | ICD-10-CM | POA: Diagnosis not present

## 2016-11-06 DIAGNOSIS — M5416 Radiculopathy, lumbar region: Secondary | ICD-10-CM | POA: Diagnosis not present

## 2016-11-08 DIAGNOSIS — M5416 Radiculopathy, lumbar region: Secondary | ICD-10-CM | POA: Diagnosis not present

## 2016-11-13 DIAGNOSIS — M5416 Radiculopathy, lumbar region: Secondary | ICD-10-CM | POA: Diagnosis not present

## 2016-11-23 ENCOUNTER — Other Ambulatory Visit: Payer: Self-pay | Admitting: Physical Medicine and Rehabilitation

## 2016-11-23 DIAGNOSIS — M5416 Radiculopathy, lumbar region: Secondary | ICD-10-CM

## 2016-11-29 ENCOUNTER — Ambulatory Visit
Admission: RE | Admit: 2016-11-29 | Discharge: 2016-11-29 | Disposition: A | Discharge status: Home / Self Care | Payer: BLUE CROSS/BLUE SHIELD | Source: Ambulatory Visit | Attending: Physical Medicine and Rehabilitation | Admitting: Physical Medicine and Rehabilitation

## 2016-11-29 DIAGNOSIS — M5416 Radiculopathy, lumbar region: Secondary | ICD-10-CM

## 2016-11-29 DIAGNOSIS — M48061 Spinal stenosis, lumbar region without neurogenic claudication: Secondary | ICD-10-CM | POA: Diagnosis not present

## 2016-12-18 ENCOUNTER — Other Ambulatory Visit: Payer: Self-pay | Admitting: Internal Medicine

## 2016-12-18 DIAGNOSIS — Z1231 Encounter for screening mammogram for malignant neoplasm of breast: Secondary | ICD-10-CM

## 2017-01-07 DIAGNOSIS — H524 Presbyopia: Secondary | ICD-10-CM | POA: Diagnosis not present

## 2017-01-08 ENCOUNTER — Ambulatory Visit
Admission: RE | Admit: 2017-01-08 | Discharge: 2017-01-08 | Disposition: A | Discharge status: Home / Self Care | Payer: BLUE CROSS/BLUE SHIELD | Source: Ambulatory Visit | Attending: Internal Medicine | Admitting: Internal Medicine

## 2017-01-08 DIAGNOSIS — Z1231 Encounter for screening mammogram for malignant neoplasm of breast: Secondary | ICD-10-CM | POA: Diagnosis not present

## 2017-04-19 ENCOUNTER — Encounter: Payer: Self-pay | Admitting: Internal Medicine

## 2017-04-19 DIAGNOSIS — R519 Headache, unspecified: Secondary | ICD-10-CM

## 2017-04-19 DIAGNOSIS — R51 Headache: Principal | ICD-10-CM

## 2017-04-19 NOTE — Telephone Encounter (Signed)
ENT referral, DX headache, likes to see Dr. Thornell Mule  Received: Today  Message Contents  Colon Branch, MD  Damita Dunnings, [place]

## 2017-04-19 NOTE — Telephone Encounter (Signed)
Referral placed.

## 2017-07-26 ENCOUNTER — Encounter: Payer: Self-pay | Admitting: Internal Medicine

## 2017-07-26 ENCOUNTER — Ambulatory Visit (INDEPENDENT_AMBULATORY_CARE_PROVIDER_SITE_OTHER): Payer: BLUE CROSS/BLUE SHIELD | Admitting: Internal Medicine

## 2017-07-26 VITALS — BP 126/74 | HR 78 | Temp 98.1°F | Resp 14 | Ht 66.0 in | Wt 187.1 lb

## 2017-07-26 DIAGNOSIS — Z23 Encounter for immunization: Secondary | ICD-10-CM | POA: Diagnosis not present

## 2017-07-26 DIAGNOSIS — Z0000 Encounter for general adult medical examination without abnormal findings: Secondary | ICD-10-CM | POA: Diagnosis not present

## 2017-07-26 DIAGNOSIS — R202 Paresthesia of skin: Secondary | ICD-10-CM

## 2017-07-26 LAB — VITAMIN B12

## 2017-07-26 LAB — COMPREHENSIVE METABOLIC PANEL
ALBUMIN: 4.1 g/dL (ref 3.5–5.2)
ALK PHOS: 74 U/L (ref 39–117)
ALT: 20 U/L (ref 0–35)
AST: 18 U/L (ref 0–37)
BILIRUBIN TOTAL: 0.4 mg/dL (ref 0.2–1.2)
BUN: 17 mg/dL (ref 6–23)
CALCIUM: 9.6 mg/dL (ref 8.4–10.5)
CO2: 35 meq/L — AB (ref 19–32)
CREATININE: 0.58 mg/dL (ref 0.40–1.20)
Chloride: 107 mEq/L (ref 96–112)
GFR: 113.22 mL/min (ref >60.00)
Glucose, Bld: 101 mg/dL — ABNORMAL HIGH (ref 70–99)
Potassium: 4.2 mEq/L (ref 3.5–5.1)
Sodium: 147 mEq/L — ABNORMAL HIGH (ref 135–145)
Total Protein: 6.7 g/dL (ref 6.0–8.3)

## 2017-07-26 LAB — TSH: TSH: 1.91 u[IU]/mL (ref 0.35–4.50)

## 2017-07-26 LAB — FOLATE: Folate: >23.9 ng/mL (ref >5.9)

## 2017-07-26 LAB — LIPID PANEL
CHOLESTEROL: 189 mg/dL (ref 0–200)
HDL: 37.5 mg/dL — AB (ref >39.00)
LDL Cholesterol: 122 mg/dL — ABNORMAL HIGH (ref 0–99)
NonHDL: 151.42
TRIGLYCERIDES: 146 mg/dL (ref 0.0–149.0)
Total CHOL/HDL Ratio: 5
VLDL: 29.2 mg/dL (ref 0.0–40.0)

## 2017-07-26 LAB — HEMOGLOBIN A1C: Hgb A1c MFr Bld: 5.8 % (ref 4.6–6.5)

## 2017-07-26 MED ORDER — AZELASTINE HCL 0.1 % NA SOLN: 2.0000 | Freq: Two times a day (BID) | NASAL | 6 refills | Status: DC | PRN

## 2017-07-26 NOTE — Patient Instructions (Signed)
GO TO THE LAB : Get the blood work     GO TO THE FRONT DESK Schedule your next appointment for a  physical exam in one year  

## 2017-07-26 NOTE — Assessment & Plan Note (Addendum)
-  Td [id number]; pnm shot 2017, prevnar:07-2016; flu shot  -Female care per gyn Last PAP 11-2015 per [facility]  MMG: 12-2016 DEXA, never done , had a late menopause (age ~ 69) -CCS:  cscope 11-2010 , tics, Dr Carlean Purl cscope [id number], 1 polyp, next per GI -Labs: CMP, FLP, X2J, TSH, J94, folic acid -Diet exercise discussed

## 2017-07-26 NOTE — Progress Notes (Signed)
Pre visit review using our clinic review tool, if applicable. No additional management support is needed unless otherwise documented below in the visit note. 

## 2017-07-26 NOTE — Progress Notes (Signed)
Subjective:    Patient ID: Paula Leblanc, female    DOB: 1961/09/29, 56 y.o.   MRN: [id number]  DOS:  07/26/2017 Type of visit - description : cpx Interval history:   has few concerns, see below   Review of Systems  Allergies: itchy eyes, nose, taking Claritin and Sudafed with relative response. Has mold at the office and thinks that may be triggering some of her symptoms Spinal stenosis, has back pain, takes Aleve from time to time. Also for the last 2 months has a steady tingling at the left pretibial area, mild, worse if she sits down for too long, better if she stands up for a while. Also has tingling from the hip to the knee, left side, on and off.  Other than above, a 14 point review of systems is negative     Past Medical History:  Diagnosis Date  . Allergy   . Carpal tunnel syndrome, bilateral   . Cataract    bil cataracts removed  . Hx of adenomatous polyp of colon 09/26/2016  . Intrinsic asthma 04/15/2015  . Osteoarthritis of both hands     Past Surgical History:  Procedure Laterality Date  . CATARACT EXTRACTION Bilateral    L 2004, R 2006  . CHOLECYSTECTOMY    . cyst removed left breast    . WISDOM TOOTH EXTRACTION      Social History   Social History  . Marital status: Married    Spouse name: N/A  . Number of children: 1  . Years of education: N/A   Occupational History  . office manager, law firm    Social History Main Topics  . Smoking status: Never Smoker  . Smokeless tobacco: Never Used  . Alcohol use No  . Drug use: No  . Sexual activity: Not on file   Other Topics Concern  . Not on file   Social History Narrative   Married w/ Rayssa Atha, daughter in law of Janisha Bueso    Daughter used to live in [place], she is back and lives w/ her      Family History  Problem Relation Age of Onset  . Colon cancer Father 19       x2 80's 91  . Diabetes Father   . Heart attack Father 4  . Stroke Father 47  . Hyperlipidemia Mother   .  Osteoarthritis Mother   . Melanoma Mother   . Breast cancer Neg Hx   . Esophageal cancer Neg Hx   . Pancreatic cancer Neg Hx   . Rectal cancer Neg Hx   . Stomach cancer Neg Hx      Allergies as of 07/26/2017      Reactions   Erythromycin    REACTION: Confusion   Sulfonamide Derivatives    Pt reported sisters x 4 have had reaction to SULFA, so pt's DR. in [place] said to avoid taking SULFA.      Medication List       Accurate as of 07/26/17 11:59 PM. Always use your most recent med list.          albuterol 108 (90 Base) MCG/ACT inhaler Commonly known as:  PROVENTIL HFA;VENTOLIN HFA Inhale 2 puffs into the lungs every 6 (six) hours as needed for wheezing or shortness of breath.   azelastine 0.1 % nasal spray Commonly known as:  ASTELIN Place 2 sprays into both nostrils 2 (two) times daily as needed for rhinitis or allergies. Use in each nostril as  directed   calcium carbonate 1250 MG capsule Take 1,250 mg by mouth daily.   calcium carbonate 750 MG chewable tablet Commonly known as:  TUMS EX Chew 1 tablet by mouth daily.   cholecalciferol 1000 units tablet Commonly known as:  VITAMIN D Take 1,000 Units by mouth daily.   cyanocobalamin 100 MCG tablet Take 100 mcg by mouth daily.   multivitamin per tablet Take 1 tablet by mouth daily.   polyethylene glycol packet Commonly known as:  MIRALAX / GLYCOLAX Take 17 g by mouth daily as needed.          Objective:   Physical Exam BP 126/74 (BP Location: Left Arm, Patient Position: Sitting, Cuff Size: Small)   Pulse 78   Temp 98.1 F (36.7 C) (Oral)   Resp 14   Ht 5\' 6"  (1.676 m)   Wt 187 lb 2 oz (84.9 kg)   SpO2 97%   BMI 30.20 kg/m   General:   Well developed, well nourished . NAD.  Neck: No  thyromegaly  HEENT:  Normocephalic . Face symmetric, atraumatic Lungs:  CTA B Normal respiratory effort, no intercostal retractions, no accessory muscle use. Heart: RRR,  no murmur.  No pretibial edema  bilaterally  Abdomen:  Not distended, soft, non-tender. No rebound or rigidity.   Skin: Exposed areas without rash. Not pale. Not jaundice Neurologic:  alert & oriented X3.  Speech normal, gait appropriate for age and unassisted Strength symmetric and appropriate for age. DTRs symmetric  Psych: Cognition and judgment appear intact.  Cooperative with normal attention span and concentration.  Behavior appropriate. No anxious or depressed appearing.    Assessment & Plan:   Assessment  Asthma MSK -CTS B -DJD  -hands, saw ortho before, had XRs -spine stenosis, Dx per Dr Nelva Bush via MRI 11-2016 ( Mild spinal stenosis L4-5. Grade 1 anterior slip with severe facet degeneratio) Menopause -- onset ~ 56 y/o  Plan: Asthma: Not an issue at this point Allergies, see ROS, rec Flonase and prescribed Astelin Spinal stenosis: normal DTRs-motor exam,  pain managed with Aleve prn;  has left leg paresthesias, sxs c/w  L4-5 stenosis seen on MRI. For completeness we'll check E6L, J44 and folic acid otherwise recommend to discuss with Dr. Nelva Bush RTC one year

## 2017-07-28 NOTE — Assessment & Plan Note (Signed)
Asthma: Not an issue at this point Allergies, see ROS, rec Flonase and prescribed Astelin Spinal stenosis: normal DTRs-motor exam,  pain managed with Aleve prn;  has left leg paresthesias, sxs c/w  L4-5 stenosis seen on MRI. For completeness we'll check M1U, A04 and folic acid otherwise recommend to discuss with Dr. Nelva Bush RTC one year

## 2017-08-29 ENCOUNTER — Ambulatory Visit (INDEPENDENT_AMBULATORY_CARE_PROVIDER_SITE_OTHER): Payer: BLUE CROSS/BLUE SHIELD | Admitting: Medical

## 2017-08-29 ENCOUNTER — Encounter: Payer: Self-pay | Admitting: Medical

## 2017-08-29 VITALS — BP 134/49 | HR 74 | Temp 98.1°F | Resp 18 | Ht 66.0 in | Wt 190.0 lb

## 2017-08-29 DIAGNOSIS — J0100 Acute maxillary sinusitis, unspecified: Secondary | ICD-10-CM | POA: Diagnosis not present

## 2017-08-29 MED ORDER — BENZONATATE 100 MG PO CAPS: 100.0000 mg | ORAL_CAPSULE | Freq: Three times a day (TID) | ORAL | 0 refills | Status: DC | PRN

## 2017-08-29 MED ORDER — AZELASTINE HCL 0.1 % NA SOLN: 2.0000 | Freq: Two times a day (BID) | NASAL | 6 refills | Status: DC | PRN

## 2017-08-29 MED ORDER — DOXYCYCLINE HYCLATE 100 MG PO TABS
100.0000 mg | ORAL_TABLET | Freq: Two times a day (BID) | ORAL | 0 refills | Status: DC
Start: 2017-08-29 — End: 2018-07-30

## 2017-08-29 MED FILL — AZELASTINE 0.1% (137 MCG) S: 0.1 | 30 days supply | Qty: 30 | Fill #0

## 2017-08-29 MED FILL — DOXYCYCLINE HYCLATE 100 MG: 100 | 10 days supply | Qty: 20 | Fill #0

## 2017-08-29 MED FILL — BENZONATATE 100 MG CAPSULE: 100 | 7 days supply | Qty: 21 | Fill #0

## 2017-08-29 NOTE — Patient Instructions (Addendum)
Your appear to have a sinus infection. I am prescribing doxycycline  antibiotic for the infection. To help with the nasal congestion I prescribed astelin(prefers not to use steroid spray). For your associated cough, I prescribed cough medicine benzonatate.  Rest, hydrate, tylenol for fever.  Follow up in 7 days or as needed.

## 2017-08-29 NOTE — Progress Notes (Signed)
Subjective:    Patient ID: Paula Leblanc, female    DOB: Apr 29, 1961, 56 y.o.   MRN: [id number]  HPI  Pt in for some recent sinus pressure and nasal congestion since Monday. States colored nasal drainage and history of sinus infection very easily. She states her head feels like size of the earth.  Some chills and sweats. No fever.      Review of Systems  Constitutional: Positive for chills and fatigue. Negative for fever.  HENT: Positive for congestion, sinus pressure and sinus pain. Negative for facial swelling, postnasal drip, sneezing, sore throat and trouble swallowing.   Respiratory: Positive for cough. Negative for chest tightness, shortness of breath and wheezing.   Cardiovascular: Negative for chest pain and palpitations.  Gastrointestinal: Negative for abdominal pain and anal bleeding.  Musculoskeletal: Negative for back pain.  Skin: Negative for rash.  Neurological: Negative for dizziness, speech difficulty, weakness and headaches.  Hematological: Negative for adenopathy. Does not bruise/bleed easily.  Psychiatric/Behavioral: Negative for behavioral problems and confusion. The patient is not hyperactive.     Past Medical History:  Diagnosis Date  . Allergy   . Carpal tunnel syndrome, bilateral   . Cataract    bil cataracts removed  . Hx of adenomatous polyp of colon 09/26/2016  . Intrinsic asthma 04/15/2015  . Osteoarthritis of both hands      Social History   Socioeconomic History  . Marital status: Married    Spouse name: Not on file  . Number of children: 1  . Years of education: Not on file  . Highest education level: Not on file  Social Needs  . Financial resource strain: Not on file  . Food insecurity - worry: Not on file  . Food insecurity - inability: Not on file  . Transportation needs - medical: Not on file  . Transportation needs - non-medical: Not on file  Occupational History  . Occupation: Glass blower/designer, law firm  Tobacco Use  . Smoking  status: Never Smoker  . Smokeless tobacco: Never Used  Substance and Sexual Activity  . Alcohol use: No  . Drug use: No  . Sexual activity: Not on file  Other Topics Concern  . Not on file  Social History Narrative   Married w/ Almond Lint, daughter in law of Suellyn Meenan    Daughter used to live in [place], she is back and lives w/ her     Past Surgical History:  Procedure Laterality Date  . CATARACT EXTRACTION Bilateral    L 2004, R 2006  . CHOLECYSTECTOMY    . cyst removed left breast    . WISDOM TOOTH EXTRACTION      Family History  Problem Relation Age of Onset  . Colon cancer Father 73       x2 80's 52  . Diabetes Father   . Heart attack Father 61  . Stroke Father 62  . Hyperlipidemia Mother   . Osteoarthritis Mother   . Melanoma Mother   . Breast cancer Neg Hx   . Esophageal cancer Neg Hx   . Pancreatic cancer Neg Hx   . Rectal cancer Neg Hx   . Stomach cancer Neg Hx     Allergies  Allergen Reactions  . Erythromycin     REACTION: Confusion  . Sulfonamide Derivatives     Pt reported sisters x 4 have had reaction to SULFA, so pt's DR. in [place] said to avoid taking SULFA.    Current Outpatient Medications on File  Prior to Visit  Medication Sig Dispense Refill  . albuterol (PROVENTIL HFA;VENTOLIN HFA) 108 (90 Base) MCG/ACT inhaler Inhale 2 puffs into the lungs every 6 (six) hours as needed for wheezing or shortness of breath. 1 Inhaler 0  . azelastine (ASTELIN) 0.1 % nasal spray Place 2 sprays into both nostrils 2 (two) times daily as needed for rhinitis or allergies. Use in each nostril as directed 30 mL 6  . calcium carbonate (TUMS EX) 750 MG chewable tablet Chew 1 tablet by mouth daily.      . calcium carbonate 1250 MG capsule Take 1,250 mg by mouth daily.    . cholecalciferol (VITAMIN D) 1000 units tablet Take 1,000 Units by mouth daily.    . cyanocobalamin 100 MCG tablet Take 100 mcg by mouth daily.      . multivitamin (THERAGRAN) per tablet Take 1 tablet  by mouth daily.      . polyethylene glycol (MIRALAX / GLYCOLAX) packet Take 17 g by mouth daily as needed.     Current Facility-Administered Medications on File Prior to Visit  Medication Dose Route Frequency Provider Last Rate Last Dose  . 0.9 %  sodium chloride infusion  500 mL Intravenous Continuous Gatha Mayer, MD        BP (!) 134/49 (BP Location: Left Arm, Patient Position: Sitting, Cuff Size: Small)   Pulse 74   Temp 98.1 F (36.7 C) (Oral)   Resp 18   Ht 5\' 6"  (1.676 m)   Wt 190 lb (86.2 kg)   SpO2 99%   BMI 30.67 kg/m       Objective:   Physical Exam  General  Mental Status - Alert. General Appearance - Well groomed. Not in acute distress.  Skin Rashes- No Rashes.  HEENT Head- Normal. Ear Auditory Canal - Left- Normal. Right - Normal.Tympanic Membrane- Left- Normal. Right- Normal. Eye Sclera/Conjunctiva- Left- Normal. Right- Normal. Nose & Sinuses Nasal Mucosa- Left-  Boggy and Congested. Right-  Boggy and  Congested.Bilateral maxillary and frontal sinus pressure. Mouth & Throat Lips: Upper Lip- Normal: no dryness, cracking, pallor, cyanosis, or vesicular eruption. Lower Lip-Normal: no dryness, cracking, pallor, cyanosis or vesicular eruption. Buccal Mucosa- Bilateral- No Aphthous ulcers. Oropharynx- No Discharge or Erythema. Tonsils: Characteristics- Bilateral- No Erythema or Congestion. Size/Enlargement- Bilateral- No enlargement. Discharge- bilateral-None.  Neck Neck- Supple. No Masses.   Chest and Lung Exam Auscultation: Breath Sounds:-Clear even and unlabored.  Cardiovascular Auscultation:Rythm- Regular, rate and rhythm. Murmurs & Other Heart Sounds:Ausculatation of the heart reveal- No Murmurs.  Lymphatic Head & Neck General Head & Neck Lymphatics: Bilateral: Description- No Localized lymphadenopathy.       Assessment & Plan:  Your appear to have a sinus infection. I am prescribing doxycycline  antibiotic for the infection. To help  with the nasal congestion I prescribed astelin. For your associated cough, I prescribed cough medicine benzonatate.  Rest, hydrate, tylenol for fever.  Follow up in 7 days or as needed.  Gabriellah Rabel, Percell Miller, PA-C

## 2017-11-05 IMAGING — DX DG CHEST 2V
2 series · 2 of 2 positions shown · non-contrast
Comparison: 01/21/2012

CLINICAL DATA: Cough since [REDACTED].

EXAM:
CHEST  2 VIEW

[chest pa]
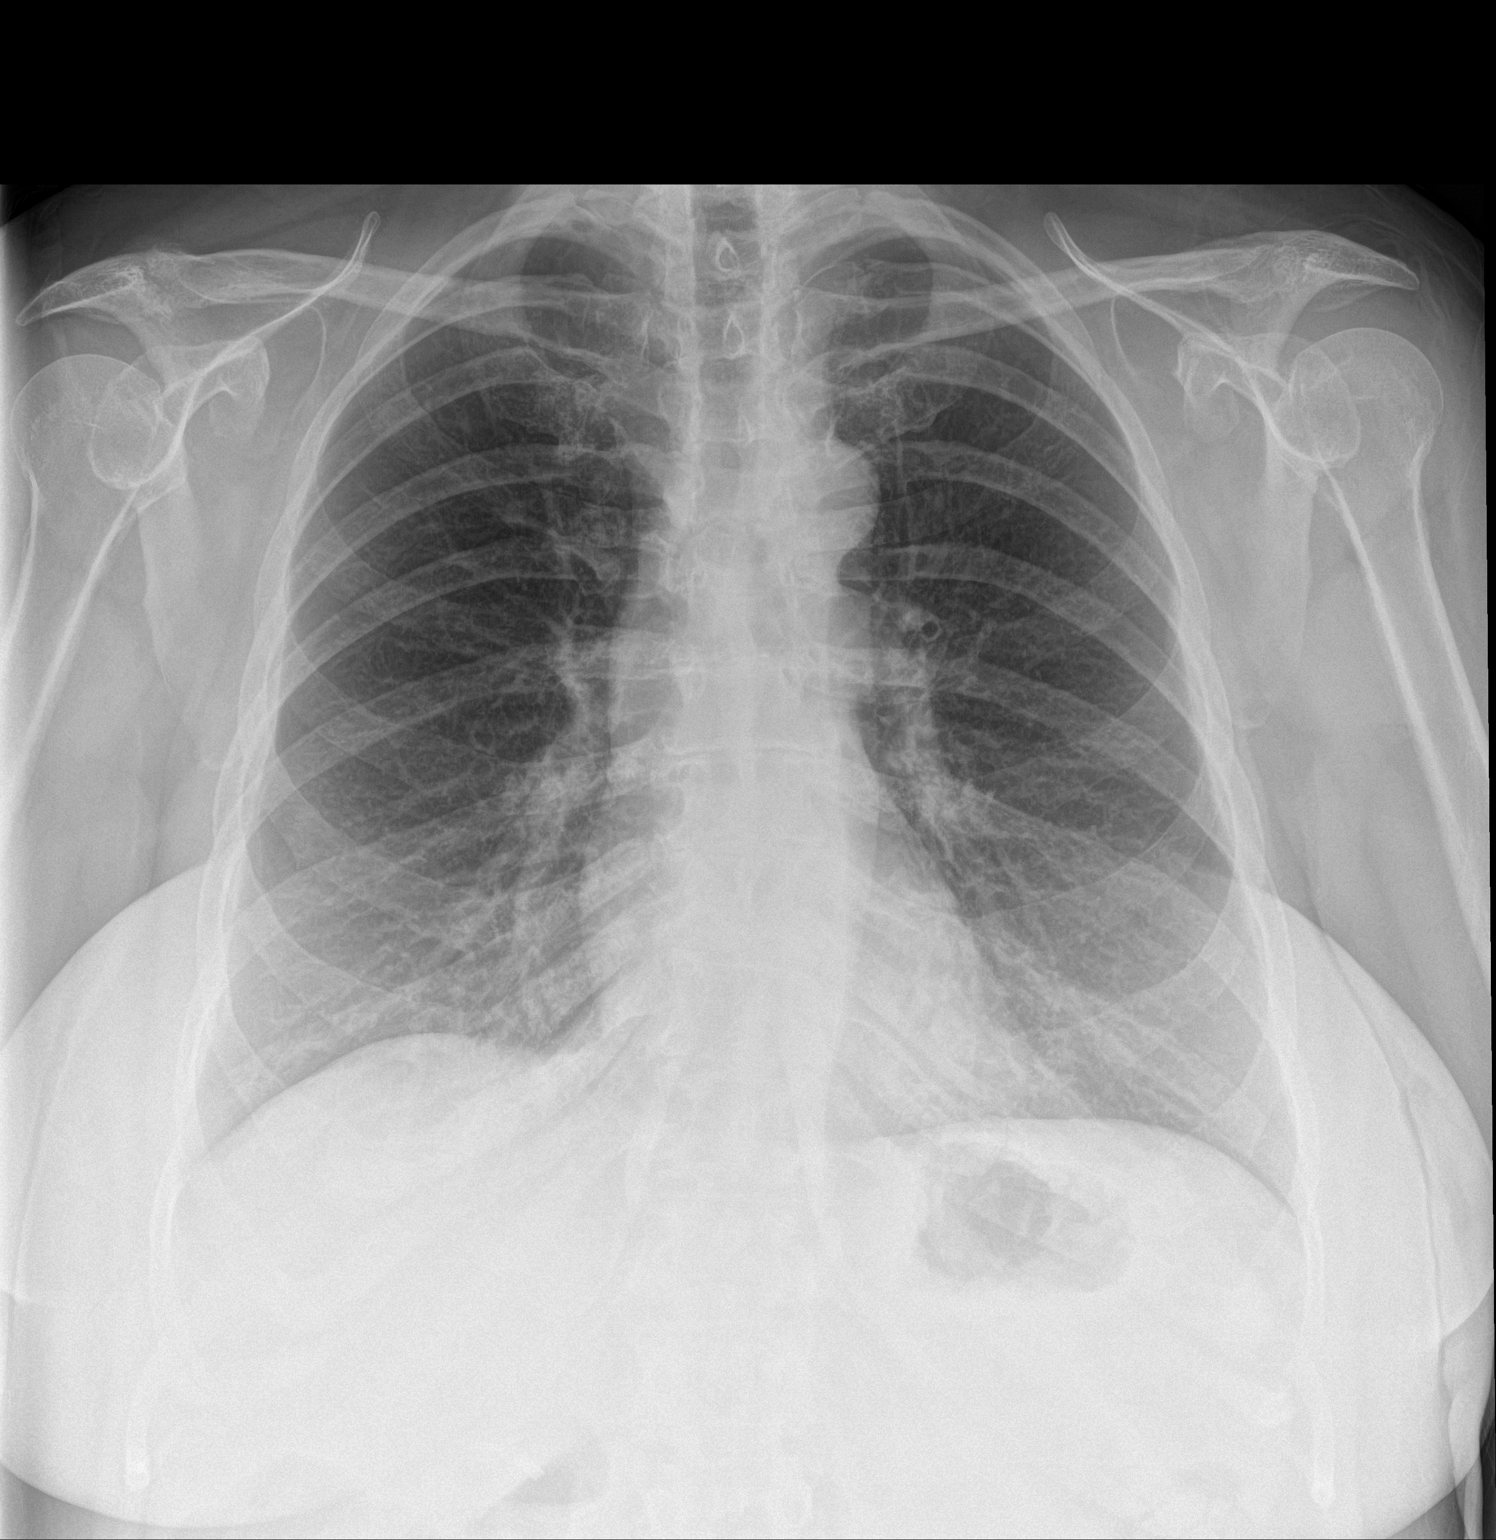

[chest lat]
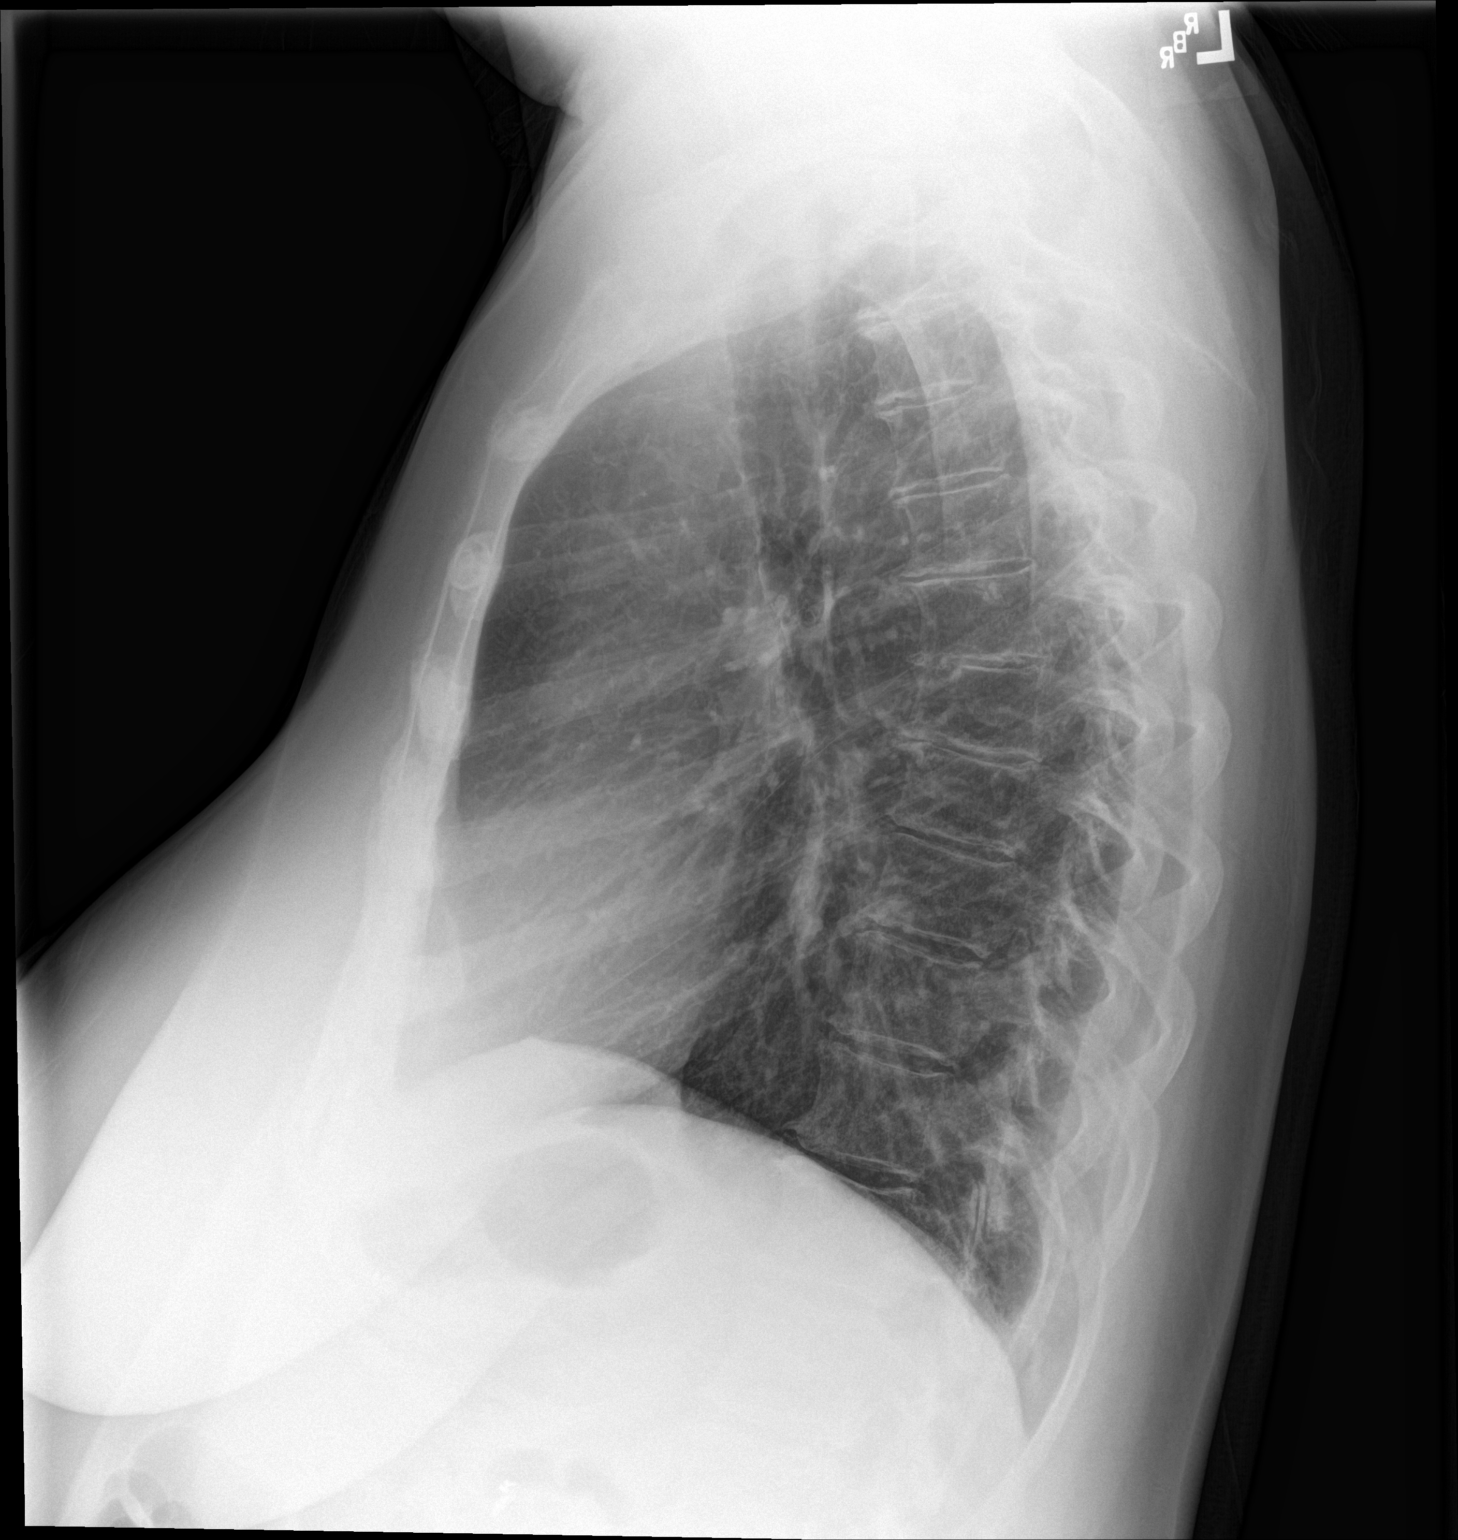

[2 of 2 positions shown; findings below may reference images not displayed]

FINDINGS: Lungs are hyperexpanded. The lungs are clear wiithout focal
pneumonia, edema, pneumothorax or pleural effusion. Interstitial
markings are diffusely coarsened with chronic features. The
cardiopericardial silhouette is within normal limits for size. The
visualized bony structures of the thorax are intact.
IMPRESSION: Hyperinflation without acute findings.

## 2018-01-28 DIAGNOSIS — H524 Presbyopia: Secondary | ICD-10-CM | POA: Diagnosis not present

## 2018-01-28 DIAGNOSIS — H1131 Conjunctival hemorrhage, right eye: Secondary | ICD-10-CM | POA: Diagnosis not present

## 2018-01-28 DIAGNOSIS — H52203 Unspecified astigmatism, bilateral: Secondary | ICD-10-CM | POA: Diagnosis not present

## 2018-04-25 ENCOUNTER — Encounter: Payer: Self-pay | Admitting: Internal Medicine

## 2018-06-23 DIAGNOSIS — G5603 Carpal tunnel syndrome, bilateral upper limbs: Secondary | ICD-10-CM | POA: Diagnosis not present

## 2018-07-30 ENCOUNTER — Encounter: Payer: Self-pay | Admitting: Internal Medicine

## 2018-07-30 ENCOUNTER — Ambulatory Visit (INDEPENDENT_AMBULATORY_CARE_PROVIDER_SITE_OTHER): Payer: BLUE CROSS/BLUE SHIELD | Admitting: Internal Medicine

## 2018-07-30 VITALS — BP 126/70 | HR 63 | Temp 97.9°F | Resp 16 | Ht 66.0 in | Wt 200.0 lb

## 2018-07-30 DIAGNOSIS — Z0000 Encounter for general adult medical examination without abnormal findings: Secondary | ICD-10-CM | POA: Diagnosis not present

## 2018-07-30 DIAGNOSIS — M65321 Trigger finger, right index finger: Secondary | ICD-10-CM | POA: Insufficient documentation

## 2018-07-30 DIAGNOSIS — Z1231 Encounter for screening mammogram for malignant neoplasm of breast: Secondary | ICD-10-CM

## 2018-07-30 DIAGNOSIS — Z23 Encounter for immunization: Secondary | ICD-10-CM

## 2018-07-30 DIAGNOSIS — G5603 Carpal tunnel syndrome, bilateral upper limbs: Secondary | ICD-10-CM | POA: Diagnosis not present

## 2018-07-30 LAB — CBC WITH DIFFERENTIAL/PLATELET
BASOS ABS: 0.1 10*3/uL (ref 0.0–0.1)
Basophils Relative: 1 % (ref 0.0–3.0)
EOS PCT: 2.5 % (ref 0.0–5.0)
Eosinophils Absolute: 0.2 10*3/uL (ref 0.0–0.7)
HCT: 43.8 % (ref 36.0–46.0)
Hemoglobin: 14.5 g/dL (ref 12.0–15.0)
Lymphocytes Relative: 26.4 % (ref 12.0–46.0)
Lymphs Abs: 1.9 10*3/uL (ref 0.7–4.0)
MCHC: 33.2 g/dL (ref 30.0–36.0)
MCV: 89.5 fl (ref 78.0–100.0)
MONO ABS: 0.4 10*3/uL (ref 0.1–1.0)
Monocytes Relative: 6.2 % (ref 3.0–12.0)
NEUTROS PCT: 63.9 % (ref 43.0–77.0)
Neutro Abs: 4.6 10*3/uL (ref 1.4–7.7)
Platelets: 261 10*3/uL (ref 150.0–400.0)
RBC: 4.89 Mil/uL (ref 3.87–5.11)
RDW: 13.4 % (ref 11.5–15.5)
WBC: 7.3 10*3/uL (ref 4.0–10.5)

## 2018-07-30 LAB — BASIC METABOLIC PANEL
BUN: 19 mg/dL (ref 6–23)
CALCIUM: 9.5 mg/dL (ref 8.4–10.5)
CO2: 33 mEq/L — ABNORMAL HIGH (ref 19–32)
Chloride: 109 mEq/L (ref 96–112)
Creatinine, Ser: 0.7 mg/dL (ref 0.40–1.20)
GFR: 90.82 mL/min (ref >60.00)
GLUCOSE: 103 mg/dL — AB (ref 70–99)
POTASSIUM: 4.4 meq/L (ref 3.5–5.1)
Sodium: 147 mEq/L — ABNORMAL HIGH (ref 135–145)

## 2018-07-30 LAB — LIPID PANEL
Cholesterol: 177 mg/dL (ref 0–200)
HDL: 37.9 mg/dL — ABNORMAL LOW (ref >39.00)
LDL Cholesterol: 106 mg/dL — ABNORMAL HIGH (ref 0–99)
NONHDL: 139.14
Total CHOL/HDL Ratio: 5
Triglycerides: 165 mg/dL — ABNORMAL HIGH (ref 0.0–149.0)
VLDL: 33 mg/dL (ref 0.0–40.0)

## 2018-07-30 LAB — HEMOGLOBIN A1C: Hgb A1c MFr Bld: 5.8 % (ref 4.6–6.5)

## 2018-07-30 NOTE — Progress Notes (Signed)
Subjective:    Patient ID: Paula Leblanc, female    DOB: 1960/10/28, 57 y.o.   MRN: [id number]  DOS:  07/30/2018 Type of visit - description : cpx Interval history:   No major concerns, we talk about her chronic medical issues  Review of Systems  A 14 point review of systems is negative    Past Medical History:  Diagnosis Date  . Allergy   . Carpal tunnel syndrome, bilateral   . Cataract    bil cataracts removed  . Hx of adenomatous polyp of colon 09/26/2016  . Intrinsic asthma 04/15/2015  . Osteoarthritis of both hands     Past Surgical History:  Procedure Laterality Date  . CATARACT EXTRACTION Bilateral    L 2004, R 2006  . CHOLECYSTECTOMY    . cyst removed left breast    . WISDOM TOOTH EXTRACTION      Social History   Socioeconomic History  . Marital status: Married    Spouse name: Not on file  . Number of children: 1  . Years of education: Not on file  . Highest education level: Not on file  Occupational History  . Occupation: Glass blower/designer, law firm  Social Needs  . Financial resource strain: Not on file  . Food insecurity:    Worry: Not on file    Inability: Not on file  . Transportation needs:    Medical: Not on file    Non-medical: Not on file  Tobacco Use  . Smoking status: Never Smoker  . Smokeless tobacco: Never Used  Substance and Sexual Activity  . Alcohol use: No  . Drug use: No  . Sexual activity: Not on file  Lifestyle  . Physical activity:    Days per week: Not on file    Minutes per session: Not on file  . Stress: Not on file  Relationships  . Social connections:    Talks on phone: Not on file    Gets together: Not on file    Attends religious service: Not on file    Active member of club or organization: Not on file    Attends meetings of clubs or organizations: Not on file    Relationship status: Not on file  . Intimate partner violence:    Fear of current or ex partner: Not on file    Emotionally abused: Not on file   Physically abused: Not on file    Forced sexual activity: Not on file  Other Topics Concern  . Not on file  Social History Narrative   Married w/ Tanee Henery, daughter in law of Thara Searing    Daughter used to live in [place], she is back and lives w/ her      Family History  Problem Relation Age of Onset  . Colon cancer Father 51       x2 80's 36  . Diabetes Father   . Heart attack Father 64  . Stroke Father 74  . Hyperlipidemia Mother   . Osteoarthritis Mother   . Melanoma Mother   . Breast cancer Neg Hx   . Esophageal cancer Neg Hx   . Pancreatic cancer Neg Hx   . Rectal cancer Neg Hx   . Stomach cancer Neg Hx      Allergies as of 07/30/2018      Reactions   Erythromycin    REACTION: Confusion   Sulfonamide Derivatives    Pt reported sisters x 4 have had reaction to SULFA, so  pt's DR. in [place] said to avoid taking SULFA.      Medication List        Accurate as of 07/30/18 11:59 PM. Always use your most recent med list.          albuterol 108 (90 Base) MCG/ACT inhaler Commonly known as:  PROVENTIL HFA;VENTOLIN HFA Inhale 2 puffs into the lungs every 6 (six) hours as needed for wheezing or shortness of breath.   azelastine 0.1 % nasal spray Commonly known as:  ASTELIN Place 2 sprays into both nostrils 2 (two) times daily as needed for rhinitis or allergies. Use in each nostril as directed   calcium carbonate 1250 MG capsule Take 1,250 mg by mouth daily.   calcium carbonate 750 MG chewable tablet Commonly known as:  TUMS EX Chew 1 tablet by mouth daily.   cholecalciferol 1000 units tablet Commonly known as:  VITAMIN D Take 1,000 Units by mouth daily.   cyanocobalamin 100 MCG tablet Take 100 mcg by mouth daily.   multivitamin per tablet Take 1 tablet by mouth daily.   polyethylene glycol packet Commonly known as:  MIRALAX / GLYCOLAX Take 17 g by mouth daily as needed.          Objective:   Physical Exam BP 126/70 (BP Location: Left Arm,  Patient Position: Sitting, Cuff Size: Small)   Pulse 63   Temp 97.9 F (36.6 C) (Oral)   Resp 16   Ht 5\' 6"  (1.676 m)   Wt 200 lb (90.7 kg)   SpO2 96%   BMI 32.28 kg/m  General: Well developed, NAD, see BMI.  Neck: No  thyromegaly  HEENT:  Normocephalic . Face symmetric, atraumatic Lungs:  CTA B Normal respiratory effort, no intercostal retractions, no accessory muscle use. Heart: RRR,  no murmur.  No pretibial edema bilaterally  Abdomen:  Not distended, soft, non-tender. No rebound or rigidity.   Skin: Exposed areas without rash. Not pale. Not jaundice Neurologic:  alert & oriented X3.  Speech normal, gait appropriate for age and unassisted Strength symmetric and appropriate for age.  Psych: Cognition and judgment appear intact.  Cooperative with normal attention span and concentration.  Behavior appropriate. No anxious or depressed appearing.     Assessment & Plan:   Assessment  Asthma MSK -CTS B -DJD  -hands, saw ortho before, had XRs -spine stenosis, Dx per Dr Nelva Bush via MRI 11-2016 ( Mild spinal stenosis L4-5. Grade 1 anterior slip with severe facet degeneratio) Menopause -- onset ~ 57 y/o  Plan: Asthma: Uses albuterol every few months CTS, bilaterally: Follow-up by Dr. Fredna Dow, they are considering surgery at some point Spinal stenosis: Continue with occasional tingling on the left foot, follow-up by Dr. Nelva Bush prn. DJD: started turmeric, helping. RTC 1 year

## 2018-07-30 NOTE — Progress Notes (Signed)
Pre visit review using our clinic review tool, if applicable. No additional management support is needed unless otherwise documented below in the visit note. 

## 2018-07-30 NOTE — Assessment & Plan Note (Addendum)
-  Td 07-2016; pnm shot 2017, prevnar:07-2015; flu shot today -Female care per gyn: Last PAP 11-2015 per [facility]  MMG: 12-2016, will schedule a MMG at this location DEXA, never done , had a late menopause (age ~ 18) -CCS:  cscope 11-2010 , tics; cscope 08-2016, 1 polyp, next 2022 per letter  -Labs: flp, bmp cbc,a1c -Diet: Just went back to weight watchers few weeks ago, already losing some weight.  Recommend routine exercise.

## 2018-07-30 NOTE — Patient Instructions (Signed)
GO TO THE LAB : Get the blood work     GO TO THE FRONT DESK Schedule your next appointment for a  Physical exam in 1 year  Please schedule your mammogram

## 2018-07-31 ENCOUNTER — Other Ambulatory Visit: Payer: Self-pay | Admitting: Orthopedic Surgery

## 2018-07-31 NOTE — Assessment & Plan Note (Signed)
Asthma: Uses albuterol every few months CTS, bilaterally: Follow-up by Dr. Fredna Dow, they are considering surgery at some point Spinal stenosis: Continue with occasional tingling on the left foot, follow-up by Dr. Nelva Bush prn. DJD: started turmeric, helping. RTC 1 year

## 2018-08-07 ENCOUNTER — Ambulatory Visit (HOSPITAL_BASED_OUTPATIENT_CLINIC_OR_DEPARTMENT_OTHER)
Admission: RE | Admit: 2018-08-07 | Discharge: 2018-08-07 | Disposition: A | Discharge status: Home / Self Care | Payer: BLUE CROSS/BLUE SHIELD | Source: Ambulatory Visit | Attending: Internal Medicine | Admitting: Internal Medicine

## 2018-08-07 DIAGNOSIS — Z1231 Encounter for screening mammogram for malignant neoplasm of breast: Secondary | ICD-10-CM | POA: Diagnosis not present

## 2018-08-11 ENCOUNTER — Other Ambulatory Visit: Payer: Self-pay | Admitting: Internal Medicine

## 2018-08-11 DIAGNOSIS — R928 Other abnormal and inconclusive findings on diagnostic imaging of breast: Secondary | ICD-10-CM

## 2018-08-13 ENCOUNTER — Ambulatory Visit
Admission: RE | Admit: 2018-08-13 | Discharge: 2018-08-13 | Disposition: A | Discharge status: Home / Self Care | Payer: BLUE CROSS/BLUE SHIELD | Source: Ambulatory Visit | Attending: Internal Medicine | Admitting: Internal Medicine

## 2018-08-13 ENCOUNTER — Other Ambulatory Visit: Payer: Self-pay | Admitting: Internal Medicine

## 2018-08-13 DIAGNOSIS — R928 Other abnormal and inconclusive findings on diagnostic imaging of breast: Secondary | ICD-10-CM

## 2018-08-13 DIAGNOSIS — N6321 Unspecified lump in the left breast, upper outer quadrant: Secondary | ICD-10-CM | POA: Diagnosis not present

## 2018-08-13 DIAGNOSIS — N6002 Solitary cyst of left breast: Secondary | ICD-10-CM

## 2018-08-14 ENCOUNTER — Encounter (HOSPITAL_BASED_OUTPATIENT_CLINIC_OR_DEPARTMENT_OTHER): Payer: Self-pay | Admitting: *Deleted

## 2018-08-14 ENCOUNTER — Other Ambulatory Visit: Payer: Self-pay

## 2018-08-19 ENCOUNTER — Encounter (HOSPITAL_BASED_OUTPATIENT_CLINIC_OR_DEPARTMENT_OTHER)
Admission: RE | Disposition: A | Discharge status: Home / Self Care | Payer: Self-pay | Source: Ambulatory Visit | Attending: Orthopedic Surgery

## 2018-08-19 ENCOUNTER — Encounter (HOSPITAL_BASED_OUTPATIENT_CLINIC_OR_DEPARTMENT_OTHER): Payer: Self-pay | Admitting: Certified Registered Nurse Anesthetist

## 2018-08-19 ENCOUNTER — Ambulatory Visit (HOSPITAL_BASED_OUTPATIENT_CLINIC_OR_DEPARTMENT_OTHER): Payer: BLUE CROSS/BLUE SHIELD | Admitting: Anesthesiology

## 2018-08-19 ENCOUNTER — Other Ambulatory Visit: Payer: Self-pay

## 2018-08-19 ENCOUNTER — Ambulatory Visit (HOSPITAL_BASED_OUTPATIENT_CLINIC_OR_DEPARTMENT_OTHER)
Admission: RE | Admit: 2018-08-19 | Discharge: 2018-08-19 | Disposition: A | Discharge status: Home / Self Care | Payer: BLUE CROSS/BLUE SHIELD | Source: Ambulatory Visit | Attending: Orthopedic Surgery | Admitting: Orthopedic Surgery

## 2018-08-19 DIAGNOSIS — M65321 Trigger finger, right index finger: Secondary | ICD-10-CM | POA: Diagnosis not present

## 2018-08-19 DIAGNOSIS — R200 Anesthesia of skin: Secondary | ICD-10-CM | POA: Diagnosis present

## 2018-08-19 DIAGNOSIS — M659 Synovitis and tenosynovitis, unspecified: Secondary | ICD-10-CM | POA: Insufficient documentation

## 2018-08-19 DIAGNOSIS — G5601 Carpal tunnel syndrome, right upper limb: Secondary | ICD-10-CM | POA: Diagnosis not present

## 2018-08-19 DIAGNOSIS — G5603 Carpal tunnel syndrome, bilateral upper limbs: Secondary | ICD-10-CM | POA: Insufficient documentation

## 2018-08-19 DIAGNOSIS — M65311 Trigger thumb, right thumb: Secondary | ICD-10-CM | POA: Diagnosis not present

## 2018-08-19 DIAGNOSIS — J45909 Unspecified asthma, uncomplicated: Secondary | ICD-10-CM | POA: Diagnosis not present

## 2018-08-19 HISTORY — DX: Other specified postprocedural states: Z98.890

## 2018-08-19 HISTORY — PX: TRIGGER FINGER RELEASE: SHX641

## 2018-08-19 HISTORY — PX: CARPAL TUNNEL RELEASE: SHX101

## 2018-08-19 HISTORY — DX: Nausea with vomiting, unspecified: R11.2

## 2018-08-19 SURGERY — CARPAL TUNNEL RELEASE
Anesthesia: General | Site: Wrist | Laterality: Right

## 2018-08-19 MED ORDER — MIDAZOLAM HCL 2 MG/2ML IJ SOLN
INTRAMUSCULAR | Status: DC | PRN
  Administered 2018-08-19: 2 mg via INTRAVENOUS

## 2018-08-19 MED ORDER — CHLORHEXIDINE GLUCONATE 4 % EX LIQD: 60.0000 mL | Freq: Once | CUTANEOUS | Status: DC

## 2018-08-19 MED ORDER — PROPOFOL 500 MG/50ML IV EMUL
INTRAVENOUS | Status: AC
  Filled 2018-08-19: qty 50

## 2018-08-19 MED ORDER — ONDANSETRON HCL 4 MG/2ML IJ SOLN
INTRAMUSCULAR | Status: DC | PRN
  Administered 2018-08-19: 4 mg via INTRAVENOUS

## 2018-08-19 MED ORDER — MIDAZOLAM HCL 2 MG/2ML IJ SOLN
INTRAMUSCULAR | Status: AC
  Filled 2018-08-19: qty 2

## 2018-08-19 MED ORDER — SCOPOLAMINE 1 MG/3DAYS TD PT72: 1.0000 | MEDICATED_PATCH | Freq: Once | TRANSDERMAL | Status: DC | PRN

## 2018-08-19 MED ORDER — LACTATED RINGERS IV SOLN
INTRAVENOUS | Status: DC
  Administered 2018-08-19: 12:00:00 via INTRAVENOUS

## 2018-08-19 MED ORDER — FENTANYL CITRATE (PF) 100 MCG/2ML IJ SOLN
INTRAMUSCULAR | Status: AC
  Filled 2018-08-19: qty 2

## 2018-08-19 MED ORDER — MIDAZOLAM HCL 2 MG/2ML IJ SOLN: 1.0000 mg | INTRAMUSCULAR | Status: DC | PRN

## 2018-08-19 MED ORDER — LIDOCAINE HCL (CARDIAC) PF 100 MG/5ML IV SOSY
PREFILLED_SYRINGE | INTRAVENOUS | Status: DC | PRN
  Administered 2018-08-19: 100 mg via INTRAVENOUS

## 2018-08-19 MED ORDER — HYDROCODONE-ACETAMINOPHEN 5-325 MG PO TABS: 1.0000 | ORAL_TABLET | Freq: Four times a day (QID) | ORAL | 0 refills | Status: DC | PRN

## 2018-08-19 MED ORDER — PROPOFOL 10 MG/ML IV BOLUS
INTRAVENOUS | Status: DC | PRN
  Administered 2018-08-19: 160 mg via INTRAVENOUS

## 2018-08-19 MED ORDER — CEFAZOLIN SODIUM-DEXTROSE 2-4 GM/100ML-% IV SOLN
INTRAVENOUS | Status: AC
  Filled 2018-08-19: qty 100

## 2018-08-19 MED ORDER — LIDOCAINE 2% (20 MG/ML) 5 ML SYRINGE
INTRAMUSCULAR | Status: AC
  Filled 2018-08-19: qty 5

## 2018-08-19 MED ORDER — CEFAZOLIN SODIUM-DEXTROSE 2-4 GM/100ML-% IV SOLN
2.0000 g | INTRAVENOUS | Status: AC
  Administered 2018-08-19: 2 g via INTRAVENOUS

## 2018-08-19 MED ORDER — FENTANYL CITRATE (PF) 100 MCG/2ML IJ SOLN
INTRAMUSCULAR | Status: DC | PRN
  Administered 2018-08-19 (×2): 50 ug via INTRAVENOUS

## 2018-08-19 MED ORDER — BUPIVACAINE HCL (PF) 0.5 % IJ SOLN
INTRAMUSCULAR | Status: DC | PRN
  Administered 2018-08-19: 10 mL

## 2018-08-19 MED ORDER — FENTANYL CITRATE (PF) 100 MCG/2ML IJ SOLN: 50.0000 ug | INTRAMUSCULAR | Status: DC | PRN

## 2018-08-19 SURGICAL SUPPLY — 38 items
BANDAGE COBAN STERILE 2 (GAUZE/BANDAGES/DRESSINGS) IMPLANT
BLADE SURG 15 STRL LF DISP TIS (BLADE) ×2 IMPLANT
BLADE SURG 15 STRL SS (BLADE) ×1
BNDG COHESIVE 3X5 TAN STRL LF (GAUZE/BANDAGES/DRESSINGS) ×3 IMPLANT
BNDG ESMARK 4X9 LF (GAUZE/BANDAGES/DRESSINGS) ×3 IMPLANT
BNDG GAUZE ELAST 4 BULKY (GAUZE/BANDAGES/DRESSINGS) ×3 IMPLANT
CHLORAPREP W/TINT 26ML (MISCELLANEOUS) ×3 IMPLANT
CORD BIPOLAR FORCEPS 12FT (ELECTRODE) ×3 IMPLANT
COVER BACK TABLE 60X90IN (DRAPES) ×3 IMPLANT
COVER MAYO STAND STRL (DRAPES) ×3 IMPLANT
COVER WAND RF STERILE (DRAPES) IMPLANT
CUFF TOURNIQUET SINGLE 18IN (TOURNIQUET CUFF) ×3 IMPLANT
DECANTER SPIKE VIAL GLASS SM (MISCELLANEOUS) IMPLANT
DRAPE EXTREMITY T 121X128X90 (DRAPE) ×3 IMPLANT
DRAPE SURG 17X23 STRL (DRAPES) ×3 IMPLANT
DRSG PAD ABDOMINAL 8X10 ST (GAUZE/BANDAGES/DRESSINGS) ×3 IMPLANT
GAUZE SPONGE 4X4 12PLY STRL (GAUZE/BANDAGES/DRESSINGS) ×3 IMPLANT
GAUZE XEROFORM 1X8 LF (GAUZE/BANDAGES/DRESSINGS) ×3 IMPLANT
GLOVE BIO SURGEON STRL SZ 6.5 (GLOVE) ×3 IMPLANT
GLOVE BIOGEL PI IND STRL 7.0 (GLOVE) ×2 IMPLANT
GLOVE BIOGEL PI IND STRL 8.5 (GLOVE) ×2 IMPLANT
GLOVE BIOGEL PI INDICATOR 7.0 (GLOVE) ×1
GLOVE BIOGEL PI INDICATOR 8.5 (GLOVE) ×1
GLOVE ECLIPSE 6.5 STRL STRAW (GLOVE) ×3 IMPLANT
GLOVE SURG ORTHO 8.0 STRL STRW (GLOVE) ×3 IMPLANT
GOWN STRL REUS W/ TWL LRG LVL3 (GOWN DISPOSABLE) ×2 IMPLANT
GOWN STRL REUS W/TWL LRG LVL3 (GOWN DISPOSABLE) ×1
GOWN STRL REUS W/TWL XL LVL3 (GOWN DISPOSABLE) ×3 IMPLANT
NEEDLE PRECISIONGLIDE 27X1.5 (NEEDLE) ×3 IMPLANT
NS IRRIG 1000ML POUR BTL (IV SOLUTION) ×3 IMPLANT
PACK BASIN DAY SURGERY FS (CUSTOM PROCEDURE TRAY) ×3 IMPLANT
STOCKINETTE 4X48 STRL (DRAPES) ×3 IMPLANT
SUT ETHILON 4 0 PS 2 18 (SUTURE) ×3 IMPLANT
SUT VICRYL 4-0 PS2 18IN ABS (SUTURE) IMPLANT
SYR BULB 3OZ (MISCELLANEOUS) ×3 IMPLANT
SYR CONTROL 10ML LL (SYRINGE) ×3 IMPLANT
TOWEL GREEN STERILE FF (TOWEL DISPOSABLE) ×6 IMPLANT
UNDERPAD 30X30 (UNDERPADS AND DIAPERS) IMPLANT

## 2018-08-19 NOTE — H&P (Signed)
Paula Leblanc is an 57 y.o. female.   Chief Complaint: numbness and catching right index HPI: Paula Leblanc is a 57 yo female.  She continues to complain of catching of her right index finger with numbness and tingling in her hand. Pain is mild to moderate in nature located in the fingers both hands all fingers being involved. Has had nerve conductions done by Dr. Thereasa Parkin in the past revealed mild carpal tunnel syndrome. She was referred to Dr. Tamsen Roers for repeat studies. These were done 2 years ago. He has been taking Aleve and that she is awakened almost every night. Injections in the past. She has a history of arthritis no history of thyroid problems diabetes gout. Only history is positive for diabetes thyroid problems arthritis and gout.   Past Medical History:  Diagnosis Date  . Allergy   . Carpal tunnel syndrome, bilateral   . Cataract    bil cataracts removed  . Hx of adenomatous polyp of colon 09/26/2016  . Intrinsic asthma 04/15/2015  . Osteoarthritis of both hands   . PONV (postoperative nausea and vomiting)     Past Surgical History:  Procedure Laterality Date  . BREAST EXCISIONAL BIOPSY Left   . CATARACT EXTRACTION Bilateral    L 2004, R 2006  . CHOLECYSTECTOMY    . cyst removed left breast    . WISDOM TOOTH EXTRACTION      Family History  Problem Relation Age of Onset  . Colon cancer Father 77       x2 80's 26  . Diabetes Father   . Heart attack Father 69  . Stroke Father 29  . Hyperlipidemia Mother   . Osteoarthritis Mother   . Melanoma Mother   . Breast cancer Neg Hx   . Esophageal cancer Neg Hx   . Pancreatic cancer Neg Hx   . Rectal cancer Neg Hx   . Stomach cancer Neg Hx    Social History:  reports that she has never smoked. She has never used smokeless tobacco. She reports that she does not drink alcohol or use drugs.  Allergies:  Allergies  Allergen Reactions  . Erythromycin     REACTION: Confusion  . Sulfonamide Derivatives     Pt reported sisters x 4  have had reaction to SULFA, so pt's DR. in [place] said to avoid taking SULFA.    No medications prior to admission.    No results found for this or any previous visit (from the past 48 hour(s)).  No results found.   Pertinent items are noted in HPI.  Height 5\' 6"  (1.676 m), weight 90.3 kg.  General appearance: alert, cooperative and appears stated age Head: Normocephalic, without obvious abnormality Neck: no JVD Resp: clear to auscultation bilaterally Cardio: regular rate and rhythm, S1, S2 normal, no murmur, click, rub or gallop GI: soft, non-tender; bowel sounds normal; no masses,  no organomegaly Extremities: Catching right ring finger with numbness and tingling right hand Pulses: 2+ and symmetric Skin: Skin color, texture, turgor normal. No rashes or lesions Neurologic: Grossly normal Incision/Wound: na  Assessment/Plan Assessment:  1. Bilateral carpal tunnel syndrome  2. Trigger index finger of right hand    Plan: She would like to have the right carpal tunnel release along with release of the A1 pulley of the index finger. Pre-peri-and postoperative course are discussed along with risk applications. She is aware there is no guarantee to the surgery the possibility of infection recurrence injury to arteries nerves tendons complete relief symptoms  and dystrophy. She is scheduled for right carpal tunnel release along with triggering release of her index finger right hand as an outpatient under regional anesthesia.   Daryll Brod 08/19/2018, 10:35 AM

## 2018-08-19 NOTE — Op Note (Signed)
NAME: Paula Leblanc MEDICAL RECORD NO: [id number] DATE OF BIRTH: January 21, 1961 FACILITY: [facility] LOCATION: [facility] PHYSICIAN: Wynonia Sours, MD   OPERATIVE REPORT   DATE OF PROCEDURE: 08/19/18    PREOPERATIVE DIAGNOSIS:   Triggering right index finger carpal tunnel syndrome right hand    POSTOPERATIVE DIAGNOSIS:   Triggering right index finger carpal tunnel syndrome right hand   PROCEDURE:   Release A1 pulley right index finger decompression median nerve right wrist   SURGEON: Daryll Brod, M.D.   ASSISTANT: none   ANESTHESIA:   General with local   INTRAVENOUS FLUIDS:  Per anesthesia flow sheet.   ESTIMATED BLOOD LOSS:  Minimal.   COMPLICATIONS:  None.   SPECIMENS:  none   TOURNIQUET TIME:    Total Tourniquet Time Documented: Upper Arm (Right) - 19 minutes Total: Upper Arm (Right) - 19 minutes    DISPOSITION:  Stable to PACU.   INDICATIONS: Patient is a 57 year old female with a history of numbness and tingling right hand catching of her right index finger which is not responded to conservative treatment.  Nerve conductions are positive for carpal tunnel syndrome.  She is elected to undergo surgical release of the A1 pulley of the index finger carpal tunnel release right hand.  Pre-peri-and postoperative course been discussed along with risks and complications.  She is aware that there is no guarantee to the surgery the possibility of infection recurrence injury to arteries nerves tendons complete release symptoms and dystrophy.  OPERATIVE COURSE: Patient brought to the operating room where general anesthetic was carried out without difficulty and that a second IV could not be established in the operative arm.  She was placed in the supine position.  She was prepped using ChloraPrep.  Her right arm was free.  A 3-minute dry time was allowed and a timeout taken to confirm patient procedure.  The limb was exsanguinated with an Esmarch bandage turn placed on  the arm was inflated to 250 mmHg.  An oblique incision was made over the A1 pulley of the right index finger.  This carried down through subcutaneous tissue.  Bleeders were electrocauterized with bipolar.  The A1 pulley was identified.  Retractors were placed protecting neurovascular bundles radially and ulnarly.  A very significant facet synovitis was present and tenosynovial tissue the flexor tendons.  The A1 pulley was released on its radial aspect of the small incision made centrally and A2 a tenosynovectomy was performed with the extensor flexor tendons.  The finger was placed through full passive range of motion no further triggering was noted.  The wound was irrigated with saline and closed with interrupted 4 nylon sutures.  A separate longitudinal incision was made in the right palm carried down through subcutaneous tissue.  Bleeders were electrocauterized with bipolar.  The palmar fascia was split.  The superficial palmar arch was identified along with the flexor tendon to the ring little finger.  Retractors were placed retracting flexor tendons median nerve radially and ulnar nerve ulnarly.  The flexor retinaculum was then released on its ulnar border.  Right angle and stool retractor placed between skin and forearm fascia.  The deep structures were dissected free with blunt dissection and a release of the proximal portion of the flexor retinaculum distal forearm fascia was then performed for approximately 2 cm proximal to the wrist crease under direct vision.  The canal was explored.  Motor branch entered in the muscle distally.  An area compression of the nerves as noted.  No further lesions were identified.  Wound was copiously irrigated with saline.  The skin was closed interrupted 4-0 nylon sutures.  A local infiltration half percent bupivacaine total of 10 cc was given to the 2 wounds.  A sterile compressive dressing with the fingers free was applied.  Inflation of the tourniquet all fingers  immediately pink.  She was taken to the recovery room for observation in satisfactory condition.  She will be discharged home to return to [facility] in 1 week on Tylenol with ibuprofen for discomfort with Norco for breakthrough.   Daryll Brod, MD Electronically signed, 08/19/18

## 2018-08-19 NOTE — Transfer of Care (Signed)
Immediate Anesthesia Transfer of Care Note  Patient: Paula Leblanc  Procedure(s) Performed: RIGHT CARPAL TUNNEL RELEASE (Right Wrist) RELEASE RIGHT INDEX TRIGGER FINGER/A-1 PULLEY (Right Finger)  Patient Location: PACU  Anesthesia Type:General  Level of Consciousness: awake, alert , oriented, drowsy and patient cooperative  Airway & Oxygen Therapy: Patient Spontanous Breathing  Post-op Assessment: Report given to RN and Post -op Vital signs reviewed and stable  Post vital signs: Reviewed and stable  Last Vitals:  Vitals Value Taken Time  BP    Temp    Pulse 73 08/19/2018 12:40 PM  Resp 14 08/19/2018 12:40 PM  SpO2 99 % 08/19/2018 12:40 PM  Vitals shown include unvalidated device data.  Last Pain:  Vitals:   08/19/18 1122  TempSrc: Oral  PainSc: 0-No pain         Complications: No apparent anesthesia complications

## 2018-08-19 NOTE — Anesthesia Preprocedure Evaluation (Addendum)
Anesthesia Evaluation  Patient identified by MRN, date of birth, ID band Patient awake    Reviewed: Allergy & Precautions, NPO status , Patient's Chart, lab work & pertinent test results  History of Anesthesia Complications (+) PONV and history of anesthetic complications  Airway Mallampati: II  TM Distance: >3 FB Neck ROM: Full    Dental  (+) Dental Advisory Given   Pulmonary asthma ,    Pulmonary exam normal breath sounds clear to auscultation       Cardiovascular negative cardio ROS Normal cardiovascular exam Rhythm:Regular Rate:Normal     Neuro/Psych negative psych ROS   GI/Hepatic negative GI ROS, Neg liver ROS,   Endo/Other  negative endocrine ROS  Renal/GU negative Renal ROS     Musculoskeletal  (+) Arthritis ,   Abdominal   Peds  Hematology negative hematology ROS (+)   Anesthesia Other Findings   Reproductive/Obstetrics negative OB ROS                             Anesthesia Physical Anesthesia Plan  ASA: II  Anesthesia Plan: General   Post-op Pain Management:    Induction: Intravenous  PONV Risk Score and Plan: 4 or greater and Ondansetron, Propofol infusion, Midazolam, Dexamethasone and Treatment may vary due to age or medical condition  Airway Management Planned: LMA  Additional Equipment: None  Intra-op Plan:   Post-operative Plan: Extubation in OR  Informed Consent: I have reviewed the patients History and Physical, chart, labs and discussed the procedure including the risks, benefits and alternatives for the proposed anesthesia with the patient or authorized representative who has indicated his/her understanding and acceptance.   Dental advisory given  Plan Discussed with: CRNA  Anesthesia Plan Comments:        Anesthesia Quick Evaluation

## 2018-08-19 NOTE — Anesthesia Procedure Notes (Signed)
Procedure Name: LMA Insertion Date/Time: 08/19/2018 12:04 PM Performed by: Raenette Rover, CRNA Pre-anesthesia Checklist: Patient identified, Emergency Drugs available, Suction available and Patient being monitored Patient Re-evaluated:Patient Re-evaluated prior to induction Oxygen Delivery Method: Circle system utilized Preoxygenation: Pre-oxygenation with 100% oxygen Induction Type: IV induction LMA: LMA inserted LMA Size: 4.0 Number of attempts: 1 Placement Confirmation: positive ETCO2,  CO2 detector and breath sounds checked- equal and bilateral Tube secured with: Tape Dental Injury: Teeth and Oropharynx as per pre-operative assessment

## 2018-08-19 NOTE — Brief Op Note (Signed)
08/19/2018  12:35 PM  PATIENT:  Smitty Pluck  57 y.o. female  PRE-OPERATIVE DIAGNOSIS:  CARPAL TUNNEL SYNDROME RIGHT WRIST, RIGHT INDEX TRIGGER FINGER  POST-OPERATIVE DIAGNOSIS:  CARPAL TUNNEL SYNDROME RIGHT WRIST, RIGHT INDEX TRIGGER FINGER  PROCEDURE:  Procedure(s): RIGHT CARPAL TUNNEL RELEASE (Right) RELEASE RIGHT INDEX TRIGGER FINGER/A-1 PULLEY (Right)  SURGEON:  Surgeon(s) and Role:    Daryll Brod, MD - Primary  PHYSICIAN ASSISTANT:   ASSISTANTS: none   ANESTHESIA:   local and general  EBL: 75ml BLOOD ADMINISTERED:none  DRAINS: none   LOCAL MEDICATIONS USED:  BUPIVICAINE   SPECIMEN:  No Specimen  DISPOSITION OF SPECIMEN:  N/A  COUNTS:  YES  TOURNIQUET:   Total Tourniquet Time Documented: Upper Arm (Right) - 19 minutes Total: Upper Arm (Right) - 19 minutes   DICTATION: .Viviann Spare Dictation  PLAN OF CARE: Discharge to home after PACU  PATIENT DISPOSITION:  PACU - hemodynamically stable.

## 2018-08-19 NOTE — Anesthesia Postprocedure Evaluation (Signed)
Anesthesia Post Note  Patient: Paula FRANQUI  Procedure(s) Performed: RIGHT CARPAL TUNNEL RELEASE (Right Wrist) RELEASE RIGHT INDEX TRIGGER FINGER/A-1 PULLEY (Right Finger)     Patient location during evaluation: PACU Anesthesia Type: General Level of consciousness: sedated and patient cooperative Pain management: pain level controlled Vital Signs Assessment: post-procedure vital signs reviewed and stable Respiratory status: spontaneous breathing Cardiovascular status: stable Anesthetic complications: no    Last Vitals:  Vitals:   08/19/18 1330 08/19/18 1400  BP: 118/72 126/68  Pulse: 60 63  Resp: 13 16  Temp:  (!) 36.2 C  SpO2: 98% 99%    Last Pain:  Vitals:   08/19/18 1400  TempSrc:   PainSc: [place]

## 2018-08-19 NOTE — Discharge Instructions (Addendum)

## 2018-08-20 ENCOUNTER — Encounter (HOSPITAL_BASED_OUTPATIENT_CLINIC_OR_DEPARTMENT_OTHER): Payer: Self-pay | Admitting: Orthopedic Surgery

## 2018-10-06 ENCOUNTER — Other Ambulatory Visit: Payer: Self-pay | Admitting: Orthopedic Surgery

## 2018-10-06 DIAGNOSIS — G5603 Carpal tunnel syndrome, bilateral upper limbs: Secondary | ICD-10-CM | POA: Diagnosis not present

## 2018-10-30 ENCOUNTER — Other Ambulatory Visit: Payer: Self-pay

## 2018-10-30 ENCOUNTER — Encounter (HOSPITAL_BASED_OUTPATIENT_CLINIC_OR_DEPARTMENT_OTHER): Payer: Self-pay | Admitting: *Deleted

## 2018-11-06 ENCOUNTER — Ambulatory Visit (HOSPITAL_BASED_OUTPATIENT_CLINIC_OR_DEPARTMENT_OTHER)
Admission: RE | Admit: 2018-11-06 | Payer: BLUE CROSS/BLUE SHIELD | Source: Ambulatory Visit | Admitting: Orthopedic Surgery

## 2018-11-06 SURGERY — CARPAL TUNNEL RELEASE
Anesthesia: General | Laterality: Left

## 2018-11-17 ENCOUNTER — Other Ambulatory Visit: Payer: Self-pay | Admitting: Orthopedic Surgery

## 2018-12-01 ENCOUNTER — Encounter (HOSPITAL_BASED_OUTPATIENT_CLINIC_OR_DEPARTMENT_OTHER): Payer: Self-pay | Admitting: *Deleted

## 2018-12-01 ENCOUNTER — Other Ambulatory Visit: Payer: Self-pay

## 2018-12-09 ENCOUNTER — Ambulatory Visit (HOSPITAL_BASED_OUTPATIENT_CLINIC_OR_DEPARTMENT_OTHER): Payer: BLUE CROSS/BLUE SHIELD | Admitting: Anesthesiology

## 2018-12-09 ENCOUNTER — Ambulatory Visit (HOSPITAL_BASED_OUTPATIENT_CLINIC_OR_DEPARTMENT_OTHER)
Admission: RE | Admit: 2018-12-09 | Discharge: 2018-12-09 | Disposition: A | Discharge status: Home / Self Care | Payer: BLUE CROSS/BLUE SHIELD | Attending: Orthopedic Surgery | Admitting: Orthopedic Surgery

## 2018-12-09 ENCOUNTER — Other Ambulatory Visit: Payer: Self-pay

## 2018-12-09 ENCOUNTER — Encounter (HOSPITAL_BASED_OUTPATIENT_CLINIC_OR_DEPARTMENT_OTHER)
Admission: RE | Disposition: A | Discharge status: Home / Self Care | Payer: Self-pay | Source: Home / Self Care | Attending: Orthopedic Surgery

## 2018-12-09 ENCOUNTER — Encounter (HOSPITAL_BASED_OUTPATIENT_CLINIC_OR_DEPARTMENT_OTHER): Payer: Self-pay

## 2018-12-09 DIAGNOSIS — R200 Anesthesia of skin: Secondary | ICD-10-CM | POA: Diagnosis present

## 2018-12-09 DIAGNOSIS — G5602 Carpal tunnel syndrome, left upper limb: Secondary | ICD-10-CM | POA: Insufficient documentation

## 2018-12-09 HISTORY — PX: CARPAL TUNNEL RELEASE: SHX101

## 2018-12-09 SURGERY — CARPAL TUNNEL RELEASE
Anesthesia: General | Site: Wrist | Laterality: Left

## 2018-12-09 MED ORDER — ACETAMINOPHEN 160 MG/5ML PO SOLN: 1000.0000 mg | Freq: Once | ORAL | Status: DC | PRN

## 2018-12-09 MED ORDER — PROPOFOL 10 MG/ML IV BOLUS
INTRAVENOUS | Status: DC | PRN
  Administered 2018-12-09: 140 mg via INTRAVENOUS

## 2018-12-09 MED ORDER — CHLORHEXIDINE GLUCONATE 4 % EX LIQD: 60.0000 mL | Freq: Once | CUTANEOUS | Status: DC

## 2018-12-09 MED ORDER — SCOPOLAMINE 1 MG/3DAYS TD PT72
1.0000 | MEDICATED_PATCH | Freq: Once | TRANSDERMAL | Status: DC | PRN
  Administered 2018-12-09: 1.5 mg via TRANSDERMAL

## 2018-12-09 MED ORDER — LIDOCAINE HCL (CARDIAC) PF 100 MG/5ML IV SOSY
PREFILLED_SYRINGE | INTRAVENOUS | Status: DC | PRN
  Administered 2018-12-09: 60 mg via INTRAVENOUS

## 2018-12-09 MED ORDER — DEXAMETHASONE SODIUM PHOSPHATE 10 MG/ML IJ SOLN
INTRAMUSCULAR | Status: AC
  Filled 2018-12-09: qty 1

## 2018-12-09 MED ORDER — BUPIVACAINE HCL (PF) 0.25 % IJ SOLN
INTRAMUSCULAR | Status: DC | PRN
  Administered 2018-12-09: 8 mL

## 2018-12-09 MED ORDER — DEXAMETHASONE SODIUM PHOSPHATE 4 MG/ML IJ SOLN
INTRAMUSCULAR | Status: DC | PRN
  Administered 2018-12-09: 10 mg via INTRAVENOUS

## 2018-12-09 MED ORDER — ACETAMINOPHEN 10 MG/ML IV SOLN: 1000.0000 mg | Freq: Once | INTRAVENOUS | Status: DC | PRN

## 2018-12-09 MED ORDER — MIDAZOLAM HCL 2 MG/2ML IJ SOLN
INTRAMUSCULAR | Status: AC
  Filled 2018-12-09: qty 2

## 2018-12-09 MED ORDER — FENTANYL CITRATE (PF) 100 MCG/2ML IJ SOLN: 25.0000 ug | INTRAMUSCULAR | Status: DC | PRN

## 2018-12-09 MED ORDER — CEFAZOLIN SODIUM-DEXTROSE 2-4 GM/100ML-% IV SOLN
INTRAVENOUS | Status: AC
  Filled 2018-12-09: qty 100

## 2018-12-09 MED ORDER — LIDOCAINE 2% (20 MG/ML) 5 ML SYRINGE
INTRAMUSCULAR | Status: AC
  Filled 2018-12-09: qty 5

## 2018-12-09 MED ORDER — FENTANYL CITRATE (PF) 100 MCG/2ML IJ SOLN
INTRAMUSCULAR | Status: AC
  Filled 2018-12-09: qty 2

## 2018-12-09 MED ORDER — LACTATED RINGERS IV SOLN
INTRAVENOUS | Status: DC
  Administered 2018-12-09: 09:00:00 via INTRAVENOUS

## 2018-12-09 MED ORDER — ONDANSETRON HCL 4 MG/2ML IJ SOLN
INTRAMUSCULAR | Status: AC
  Filled 2018-12-09: qty 2

## 2018-12-09 MED ORDER — OXYCODONE HCL 5 MG/5ML PO SOLN: 5.0000 mg | Freq: Once | ORAL | Status: DC | PRN

## 2018-12-09 MED ORDER — ONDANSETRON HCL 4 MG/2ML IJ SOLN
INTRAMUSCULAR | Status: DC | PRN
  Administered 2018-12-09: 4 mg via INTRAVENOUS

## 2018-12-09 MED ORDER — CEFAZOLIN SODIUM-DEXTROSE 2-4 GM/100ML-% IV SOLN
2.0000 g | INTRAVENOUS | Status: AC
  Administered 2018-12-09: 2 g via INTRAVENOUS

## 2018-12-09 MED ORDER — TRAMADOL HCL 50 MG PO TABS: 50.0000 mg | ORAL_TABLET | Freq: Four times a day (QID) | ORAL | 0 refills | Status: DC | PRN

## 2018-12-09 MED ORDER — SCOPOLAMINE 1 MG/3DAYS TD PT72
MEDICATED_PATCH | TRANSDERMAL | Status: AC
  Filled 2018-12-09: qty 1

## 2018-12-09 MED ORDER — OXYCODONE HCL 5 MG PO TABS: 5.0000 mg | ORAL_TABLET | Freq: Once | ORAL | Status: DC | PRN

## 2018-12-09 MED ORDER — MIDAZOLAM HCL 2 MG/2ML IJ SOLN
1.0000 mg | INTRAMUSCULAR | Status: DC | PRN
  Administered 2018-12-09: 1 mg via INTRAVENOUS

## 2018-12-09 MED ORDER — FENTANYL CITRATE (PF) 100 MCG/2ML IJ SOLN
50.0000 ug | INTRAMUSCULAR | Status: DC | PRN
  Administered 2018-12-09 (×2): 50 ug via INTRAVENOUS

## 2018-12-09 MED ORDER — ACETAMINOPHEN 500 MG PO TABS: 1000.0000 mg | ORAL_TABLET | Freq: Once | ORAL | Status: DC | PRN

## 2018-12-09 SURGICAL SUPPLY — 35 items
BLADE SURG 15 STRL LF DISP TIS (BLADE) ×1 IMPLANT
BLADE SURG 15 STRL SS (BLADE) ×1
BNDG COHESIVE 3X5 TAN STRL LF (GAUZE/BANDAGES/DRESSINGS) ×2 IMPLANT
BNDG ESMARK 4X9 LF (GAUZE/BANDAGES/DRESSINGS) ×2 IMPLANT
BNDG GAUZE ELAST 4 BULKY (GAUZE/BANDAGES/DRESSINGS) ×2 IMPLANT
CHLORAPREP W/TINT 26 (MISCELLANEOUS) ×2 IMPLANT
CORD BIPOLAR FORCEPS 12FT (ELECTRODE) ×2 IMPLANT
COVER BACK TABLE 60X90IN (DRAPES) ×2 IMPLANT
COVER MAYO STAND STRL (DRAPES) ×2 IMPLANT
COVER WAND RF STERILE (DRAPES) IMPLANT
CUFF TOURNIQUET SINGLE 18IN (TOURNIQUET CUFF) ×2 IMPLANT
DRAPE EXTREMITY T 121X128X90 (DISPOSABLE) ×2 IMPLANT
DRAPE SURG 17X23 STRL (DRAPES) ×2 IMPLANT
DRSG PAD ABDOMINAL 8X10 ST (GAUZE/BANDAGES/DRESSINGS) ×2 IMPLANT
GAUZE SPONGE 4X4 12PLY STRL (GAUZE/BANDAGES/DRESSINGS) ×2 IMPLANT
GAUZE XEROFORM 1X8 LF (GAUZE/BANDAGES/DRESSINGS) ×2 IMPLANT
GLOVE BIO SURGEON STRL SZ 6.5 (GLOVE) ×4 IMPLANT
GLOVE BIOGEL PI IND STRL 7.0 (GLOVE) ×3 IMPLANT
GLOVE BIOGEL PI IND STRL 8.5 (GLOVE) ×1 IMPLANT
GLOVE BIOGEL PI INDICATOR 7.0 (GLOVE) ×3
GLOVE BIOGEL PI INDICATOR 8.5 (GLOVE) ×1
GLOVE SURG ORTHO 8.0 STRL STRW (GLOVE) ×2 IMPLANT
GOWN STRL REUS W/ TWL LRG LVL3 (GOWN DISPOSABLE) ×3 IMPLANT
GOWN STRL REUS W/TWL LRG LVL3 (GOWN DISPOSABLE) ×3
GOWN STRL REUS W/TWL XL LVL3 (GOWN DISPOSABLE) ×2 IMPLANT
NEEDLE PRECISIONGLIDE 27X1.5 (NEEDLE) ×2 IMPLANT
NS IRRIG 1000ML POUR BTL (IV SOLUTION) ×2 IMPLANT
PACK BASIN DAY SURGERY FS (CUSTOM PROCEDURE TRAY) ×2 IMPLANT
STOCKINETTE 4X48 STRL (DRAPES) ×2 IMPLANT
SUT ETHILON 4 0 PS 2 18 (SUTURE) ×2 IMPLANT
SUT VICRYL 4-0 PS2 18IN ABS (SUTURE) IMPLANT
SYR BULB 3OZ (MISCELLANEOUS) ×2 IMPLANT
SYR CONTROL 10ML LL (SYRINGE) IMPLANT
TOWEL GREEN STERILE FF (TOWEL DISPOSABLE) ×2 IMPLANT
UNDERPAD 30X30 (UNDERPADS AND DIAPERS) ×2 IMPLANT

## 2018-12-09 NOTE — Anesthesia Procedure Notes (Signed)
Procedure Name: LMA Insertion Date/Time: 12/09/2018 9:42 AM Performed by: Lyndee Leo, CRNA Pre-anesthesia Checklist: Patient identified, Emergency Drugs available, Suction available and Patient being monitored Patient Re-evaluated:Patient Re-evaluated prior to induction Oxygen Delivery Method: Circle system utilized Preoxygenation: Pre-oxygenation with 100% oxygen Induction Type: IV induction Ventilation: Mask ventilation without difficulty LMA: LMA inserted LMA Size: 4.0 Number of attempts: 1 Airway Equipment and Method: Bite block Placement Confirmation: positive ETCO2 Tube secured with: Tape Dental Injury: Teeth and Oropharynx as per pre-operative assessment

## 2018-12-09 NOTE — Brief Op Note (Signed)
12/09/2018  10:14 AM  PATIENT:  Paula Leblanc  58 y.o. female  PRE-OPERATIVE DIAGNOSIS:  LEFT CARPAL TUNNEL SYNDROME  POST-OPERATIVE DIAGNOSIS:  LEFT CARPAL TUNNEL SYNDROME  PROCEDURE:  Procedure(s): CARPAL TUNNEL RELEASE (Left)  SURGEON:  Surgeon(s) and Role:    * Daryll Brod, MD - Primary  PHYSICIAN ASSISTANT:   ASSISTANTS: none   ANESTHESIA:   local and IV sedation  EBL:  50ml BLOOD ADMINISTERED:none  DRAINS: none   LOCAL MEDICATIONS USED:  BUPIVICAINE   SPECIMEN:  No Specimen  DISPOSITION OF SPECIMEN:  N/A  COUNTS:  YES  TOURNIQUET:   Total Tourniquet Time Documented: Upper Arm (Left) - 15 minutes Total: Upper Arm (Left) - 15 minutes   DICTATION: .Viviann Spare Dictation  PLAN OF CARE: Discharge to home after PACU  PATIENT DISPOSITION:  PACU - hemodynamically stable.

## 2018-12-09 NOTE — Discharge Instructions (Addendum)

## 2018-12-09 NOTE — Anesthesia Postprocedure Evaluation (Signed)
Anesthesia Post Note  Patient: Rye Dorado Connon  Procedure(s) Performed: CARPAL TUNNEL RELEASE (Left Wrist)     Patient location during evaluation: PACU Anesthesia Type: General Level of consciousness: awake and alert Pain management: pain level controlled Vital Signs Assessment: post-procedure vital signs reviewed and stable Respiratory status: spontaneous breathing, nonlabored ventilation, respiratory function stable and patient connected to nasal cannula oxygen Cardiovascular status: blood pressure returned to baseline and stable Postop Assessment: no apparent nausea or vomiting Anesthetic complications: no    Last Vitals:  Vitals:   12/09/18 1030 12/09/18 1045  BP: 120/77 (!) 125/58  Pulse: (!) 56 (!) 56  Resp: (!) 8 16  Temp:  36.6 C  SpO2: 95% 100%    Last Pain:  Vitals:   12/09/18 1045  TempSrc:   PainSc: 0-No pain                 Eliott Amparan

## 2018-12-09 NOTE — Transfer of Care (Signed)
Immediate Anesthesia Transfer of Care Note  Patient: Paula Leblanc  Procedure(s) Performed: CARPAL TUNNEL RELEASE (Left Wrist)  Patient Location: PACU  Anesthesia Type:Bier block  Level of Consciousness: awake, sedated and patient cooperative  Airway & Oxygen Therapy: Patient Spontanous Breathing and Patient connected to face mask oxygen  Post-op Assessment: Report given to RN and Post -op Vital signs reviewed and stable  Post vital signs: Reviewed and stable  Last Vitals:  Vitals Value Taken Time  BP    Temp    Pulse 62 12/09/2018 10:13 AM  Resp    SpO2 95 % 12/09/2018 10:13 AM  Vitals shown include unvalidated device data.  Last Pain:  Vitals:   12/09/18 0827  TempSrc: Oral  PainSc: 0-No pain         Complications: No apparent anesthesia complications

## 2018-12-09 NOTE — Op Note (Signed)
NAME: Paula Leblanc MEDICAL RECORD NO: [id number] DATE OF BIRTH: January 07, 1961 [facility]AC[facility] LOCATION: [facility] PHYSICIAN: Wynonia Sours, MD   OPERATIVE REPORT   DATE OF PROCEDURE: 12/09/18    PREOPERATIVE DIAGNOSIS:   Carpal tunnel syndrome left hand   POSTOPERATIVE DIAGNOSIS:   Same   PROCEDURE:   Decompression median nerve left hand   SURGEON: Daryll Brod, M.D.   ASSISTANT: none   ANESTHESIA:  Local and Local with sedation   INTRAVENOUS FLUIDS:  Per anesthesia flow sheet.   ESTIMATED BLOOD LOSS:  Minimal.   COMPLICATIONS:  None.   SPECIMENS:  none   TOURNIQUET TIME:    Total Tourniquet Time Documented: Upper Arm (Left) - 15 minutes Total: Upper Arm (Left) - 15 minutes    DISPOSITION:  Stable to PACU.   INDICATIONS: Patient is a 58-year-old female with a history of bilateral numbness and tingling nerve conductions positive for carpal tunnel syndrome.  She is undergone release on her right side is admitted now for release to the left side.  Pre-peri-and postoperative course been discussed along with risks and complications.  She is aware there is no guarantee to the surgery the possibility of infection recurrence injury to arteries nerves tendons complete relief symptoms and dystrophy.  She is advised we are attempting to halt the process allow the nerve to improve but cannot guarantee that.  Preoperative area the patient is seen extremity marked with both patient and surgeon antibiotic given  OPERATIVE COURSE: Patient is brought to the operating room where she is placed in a supine position sedated.  General anesthetic given.  She was prepped using ChloraPrep.  A three-minute dry time was allowed timeout taken confirming patient procedure.  Was done to the left arm.  The limb was exsanguinated with an Esmarch bandage tourniquet placed high in the arm was inflated to 250 mmHg.  A longitudinal incision was made left palm carried down through subcutaneous  tissue.  Bleeders were electrocauterized with bipolar.  The palmar fascia was split.  Superficial palmar arch was identified.  The flexor tendon to the ring little finger was identified.  Retractors were placed placed retracting median nerve flexor tendons radially ulnar nerve ulnarly.  The flexor retinaculum was then released on its ulnar border.  Right angle and stool retractor placed between skin and forearm fascia.  Deep structures were dissected free.  Blunt scissors were then used to release the proximal aspect of the flexor retinaculum distal forearm fascia for approximately 2 cm proximal to the wrist crease under direct vision.  The canal was explored.  An area compression of the nerve was apparent.  No further lesions were identified.  The motor branch entered into muscle distally.  The wound was copiously irrigated with saline.  The skin was closed interrupted 4-0 nylon sutures.  Local infiltration quarter percent bupivacaine without epinephrine was given approximately 8 cc was used.  Sterile compressive dressing with the fingers 3 was applied.  Deflation of the tourniquet all fingers immediately pink.  She was taken to the recovery room for observation in satisfactory condition.  She will be discharged home to return to [facility] in 1week Tylenol ibuprofen with Ultram as a backup.   Daryll Brod, MD Electronically signed, 12/09/18

## 2018-12-09 NOTE — H&P (Signed)
Paula Leblanc is an 58 y.o. female.   Chief Complaint: numbness hand left [id number] is a 58 yo female with numbness and tingling in her hand. Pain is mild to moderate in nature located in the fingers both hands all fingers being involved. Has had nerve conductions done by Dr. Thereasa Parkin in the past revealed mild carpal tunnel syndrome. She was referred to Dr. Tamsen Roers for repeat studies. Done 2 years ago. He has been taking Aleve and that she is awakened almost every night. Injections in the past. She has a history of arthritis no history of thyroid problems diabetes gout. Only history is positive for diabetes thyroid problems arthritis and gout.  Nerve conduction show progression of her carpal tunnel syndrome on her right side.and carpal tunnel syndrome bilaterally. She has undergone carpal tunnel release right hand. A trigger finger release to the index.    Past Medical History:  Diagnosis Date  . Allergy   . Carpal tunnel syndrome, bilateral   . Cataract    bil cataracts removed  . Hx of adenomatous polyp of colon 09/26/2016  . Intrinsic asthma 04/15/2015  . Osteoarthritis of both hands   . PONV (postoperative nausea and vomiting)     Past Surgical History:  Procedure Laterality Date  . BREAST EXCISIONAL BIOPSY Left   . CARPAL TUNNEL RELEASE Right 08/19/2018   Procedure: RIGHT CARPAL TUNNEL RELEASE;  Surgeon: Daryll Brod, MD;  Location: [facility];  Service: Orthopedics;  Laterality: Right;  . CATARACT EXTRACTION Bilateral    L 2004, R 2006  . CHOLECYSTECTOMY    . cyst removed left breast    . TRIGGER FINGER RELEASE Right 08/19/2018   Procedure: RELEASE RIGHT INDEX TRIGGER FINGER/A-1 PULLEY;  Surgeon: Daryll Brod, MD;  Location: [facility];  Service: Orthopedics;  Laterality: Right;  . WISDOM TOOTH EXTRACTION      Family History  Problem Relation Age of Onset  . Colon cancer Father 82       x2 80's 20  . Diabetes Father   . Heart attack Father 43   . Stroke Father 26  . Hyperlipidemia Mother   . Osteoarthritis Mother   . Melanoma Mother   . Breast cancer Neg Hx   . Esophageal cancer Neg Hx   . Pancreatic cancer Neg Hx   . Rectal cancer Neg Hx   . Stomach cancer Neg Hx    Social History:  reports that she has never smoked. She has never used smokeless tobacco. She reports that she does not drink alcohol or use drugs.  Allergies:  Allergies  Allergen Reactions  . Erythromycin     REACTION: Confusion  . Sulfonamide Derivatives     Pt reported sisters x 4 have had reaction to SULFA, so pt's DR. in [place] said to avoid taking SULFA.  . Tape Rash    No medications prior to admission.    No results found for this or any previous visit (from the past 48 hour(s)).  No results found.   Pertinent items are noted in HPI.  Height 5\' 6"  (1.676 m), weight 90.7 kg.  General appearance: alert, cooperative and appears stated age Head: Normocephalic, without obvious abnormality Neck: no JVD Resp: clear to auscultation bilaterally Cardio: regular rate and rhythm, S1, S2 normal, no murmur, click, rub or gallop GI: soft, non-tender; bowel sounds normal; no masses,  no organomegaly Extremities: numbness left hand Pulses: 2+ and symmetric Skin: Skin color, texture, turgor normal. No rashes or lesions Neurologic:  Grossly normal Incision/Wound: na  Assessment/Plan Assessment:  1. Bilateral carpal tunnel syndrome    Plan: She would like to proceed to have a left carpal tunnel release done. Pre-peri-and postoperative course are discussed along with risk complications. She is aware there is no guarantee to the surgery the possibility of infection recurrence injury to arteries nerves tendons complete relief symptoms and dystrophy. She is scheduled for carpal tunnel release left hand as an outpatient.   Daryll Brod 12/09/2018, 4:28 AM

## 2018-12-09 NOTE — Anesthesia Preprocedure Evaluation (Signed)
Anesthesia Evaluation  Patient identified by MRN, date of birth, ID band Patient awake    Reviewed: Allergy & Precautions, NPO status , Patient's Chart, lab work & pertinent test results  History of Anesthesia Complications (+) PONV and history of anesthetic complications  Airway Mallampati: II  TM Distance: >3 FB Neck ROM: Full    Dental  (+) Dental Advisory Given   Pulmonary asthma ,    breath sounds clear to auscultation       Cardiovascular negative cardio ROS   Rhythm:Regular Rate:Normal     Neuro/Psych negative psych ROS   GI/Hepatic negative GI ROS, Neg liver ROS,   Endo/Other  negative endocrine ROS  Renal/GU negative Renal ROS     Musculoskeletal  (+) Arthritis ,   Abdominal   Peds  Hematology negative hematology ROS (+)   Anesthesia Other Findings   Reproductive/Obstetrics                             Anesthesia Physical Anesthesia Plan  ASA: II  Anesthesia Plan: MAC and Bier Block and Bier Block-LIDOCAINE ONLY   Post-op Pain Management:    Induction:   PONV Risk Score and Plan: 3 and Treatment may vary due to age or medical condition and Propofol infusion  Airway Management Planned: Nasal Cannula  Additional Equipment:   Intra-op Plan:   Post-operative Plan:   Informed Consent: I have reviewed the patients History and Physical, chart, labs and discussed the procedure including the risks, benefits and alternatives for the proposed anesthesia with the patient or authorized representative who has indicated his/her understanding and acceptance.     Dental advisory given  Plan Discussed with: CRNA and Surgeon  Anesthesia Plan Comments:         Anesthesia Quick Evaluation

## 2018-12-10 ENCOUNTER — Encounter (HOSPITAL_BASED_OUTPATIENT_CLINIC_OR_DEPARTMENT_OTHER): Payer: Self-pay | Admitting: Orthopedic Surgery

## 2019-02-03 DIAGNOSIS — H04123 Dry eye syndrome of bilateral lacrimal glands: Secondary | ICD-10-CM | POA: Diagnosis not present

## 2019-02-03 DIAGNOSIS — H524 Presbyopia: Secondary | ICD-10-CM | POA: Diagnosis not present

## 2019-02-03 DIAGNOSIS — Z961 Presence of intraocular lens: Secondary | ICD-10-CM | POA: Diagnosis not present

## 2019-02-03 DIAGNOSIS — H52203 Unspecified astigmatism, bilateral: Secondary | ICD-10-CM | POA: Diagnosis not present

## 2019-02-12 ENCOUNTER — Ambulatory Visit
Admission: RE | Admit: 2019-02-12 | Discharge: 2019-02-12 | Disposition: A | Discharge status: Home / Self Care | Payer: BLUE CROSS/BLUE SHIELD | Source: Ambulatory Visit | Attending: Internal Medicine | Admitting: Internal Medicine

## 2019-02-12 ENCOUNTER — Other Ambulatory Visit: Payer: Self-pay | Admitting: Internal Medicine

## 2019-02-12 ENCOUNTER — Other Ambulatory Visit: Payer: BLUE CROSS/BLUE SHIELD

## 2019-02-12 ENCOUNTER — Other Ambulatory Visit: Payer: Self-pay

## 2019-02-12 DIAGNOSIS — N6002 Solitary cyst of left breast: Secondary | ICD-10-CM

## 2019-02-17 DIAGNOSIS — M545 Low back pain: Secondary | ICD-10-CM | POA: Diagnosis not present

## 2019-02-17 DIAGNOSIS — M5416 Radiculopathy, lumbar region: Secondary | ICD-10-CM | POA: Diagnosis not present

## 2019-04-21 DIAGNOSIS — L720 Epidermal cyst: Secondary | ICD-10-CM | POA: Diagnosis not present

## 2019-04-21 DIAGNOSIS — D1801 Hemangioma of skin and subcutaneous tissue: Secondary | ICD-10-CM | POA: Diagnosis not present

## 2019-04-21 DIAGNOSIS — D225 Melanocytic nevi of trunk: Secondary | ICD-10-CM | POA: Diagnosis not present

## 2019-04-21 DIAGNOSIS — L821 Other seborrheic keratosis: Secondary | ICD-10-CM | POA: Diagnosis not present

## 2019-08-03 ENCOUNTER — Other Ambulatory Visit: Payer: Self-pay

## 2019-08-04 ENCOUNTER — Other Ambulatory Visit: Payer: Self-pay

## 2019-08-04 ENCOUNTER — Ambulatory Visit (INDEPENDENT_AMBULATORY_CARE_PROVIDER_SITE_OTHER): Payer: BC Managed Care – PPO | Admitting: Internal Medicine

## 2019-08-04 ENCOUNTER — Encounter: Payer: Self-pay | Admitting: Internal Medicine

## 2019-08-04 ENCOUNTER — Other Ambulatory Visit: Payer: BLUE CROSS/BLUE SHIELD

## 2019-08-04 VITALS — BP 121/39 | HR 68 | Temp 96.2°F | Resp 16 | Ht 66.0 in | Wt 189.0 lb

## 2019-08-04 DIAGNOSIS — Z0000 Encounter for general adult medical examination without abnormal findings: Secondary | ICD-10-CM

## 2019-08-04 DIAGNOSIS — R0683 Snoring: Secondary | ICD-10-CM

## 2019-08-04 LAB — CBC WITH DIFFERENTIAL/PLATELET
Basophils Absolute: 0.1 10*3/uL (ref 0.0–0.1)
Basophils Relative: 0.8 % (ref 0.0–3.0)
Eosinophils Absolute: 0.2 10*3/uL (ref 0.0–0.7)
Eosinophils Relative: 2.7 % (ref 0.0–5.0)
HCT: 43.3 % (ref 36.0–46.0)
Hemoglobin: 14.2 g/dL (ref 12.0–15.0)
Lymphocytes Relative: 25.1 % (ref 12.0–46.0)
Lymphs Abs: 1.9 10*3/uL (ref 0.7–4.0)
MCHC: 32.7 g/dL (ref 30.0–36.0)
MCV: 90 fl (ref 78.0–100.0)
Monocytes Absolute: 0.5 10*3/uL (ref 0.1–1.0)
Monocytes Relative: 6.8 % (ref 3.0–12.0)
Neutro Abs: 4.8 10*3/uL (ref 1.4–7.7)
Neutrophils Relative %: 64.6 % (ref 43.0–77.0)
Platelets: 248 10*3/uL (ref 150.0–400.0)
RBC: 4.81 Mil/uL (ref 3.87–5.11)
RDW: 13.1 % (ref 11.5–15.5)
WBC: 7.5 10*3/uL (ref 4.0–10.5)

## 2019-08-04 LAB — LIPID PANEL
Cholesterol: 168 mg/dL (ref 0–200)
HDL: 34.8 mg/dL — ABNORMAL LOW (ref >39.00)
LDL Cholesterol: 111 mg/dL — ABNORMAL HIGH (ref 0–99)
NonHDL: 132.77
Total CHOL/HDL Ratio: 5
Triglycerides: 110 mg/dL (ref 0.0–149.0)
VLDL: 22 mg/dL (ref 0.0–40.0)

## 2019-08-04 LAB — COMPREHENSIVE METABOLIC PANEL
ALT: 20 U/L (ref 0–35)
AST: 16 U/L (ref 0–37)
Albumin: 4.2 g/dL (ref 3.5–5.2)
Alkaline Phosphatase: 81 U/L (ref 39–117)
BUN: 18 mg/dL (ref 6–23)
CO2: 34 mEq/L — ABNORMAL HIGH (ref 19–32)
Calcium: 9.3 mg/dL (ref 8.4–10.5)
Chloride: 107 mEq/L (ref 96–112)
Creatinine, Ser: 0.68 mg/dL (ref 0.40–1.20)
GFR: 88.06 mL/min (ref >60.00)
Glucose, Bld: 105 mg/dL — ABNORMAL HIGH (ref 70–99)
Potassium: 4.8 mEq/L (ref 3.5–5.1)
Sodium: 145 mEq/L (ref 135–145)
Total Bilirubin: 0.4 mg/dL (ref 0.2–1.2)
Total Protein: 6.8 g/dL (ref 6.0–8.3)

## 2019-08-04 NOTE — Progress Notes (Signed)
Subjective:    Patient ID: Paula Leblanc, female    DOB: 10-09-1960, 58 y.o.   MRN: [id number]  DOS:  08/04/2019 Type of visit - description: CPX In general feeling well She did report some loud snoring, feeling slightly tired. Occasions: Hardly ever uses tramadol or albuterol   Review of Systems  Other than above, a 14 point review of systems is negative    Past Medical History:  Diagnosis Date  . Allergy   . Carpal tunnel syndrome, bilateral   . Cataract    bil cataracts removed  . Hx of adenomatous polyp of colon 09/26/2016  . Intrinsic asthma 04/15/2015  . Osteoarthritis of both hands   . PONV (postoperative nausea and vomiting)     Past Surgical History:  Procedure Laterality Date  . BREAST EXCISIONAL BIOPSY Left   . CARPAL TUNNEL RELEASE Right 08/19/2018   Procedure: RIGHT CARPAL TUNNEL RELEASE;  Surgeon: Daryll Brod, MD;  Location: [place];  Service: Orthopedics;  Laterality: Right;  . CARPAL TUNNEL RELEASE Left 12/09/2018   Procedure: CARPAL TUNNEL RELEASE;  Surgeon: Daryll Brod, MD;  Location: [place];  Service: Orthopedics;  Laterality: Left;  . CATARACT EXTRACTION Bilateral    L 2004, R 2006  . CHOLECYSTECTOMY    . cyst removed left breast    . TRIGGER FINGER RELEASE Right 08/19/2018   Procedure: RELEASE RIGHT INDEX TRIGGER FINGER/A-1 PULLEY;  Surgeon: Daryll Brod, MD;  Location: [place];  Service: Orthopedics;  Laterality: Right;  . WISDOM TOOTH EXTRACTION      Social History   Socioeconomic History  . Marital status: Married    Spouse name: Not on file  . Number of children: 1  . Years of education: Not on file  . Highest education level: Not on file  Occupational History  . Occupation: Glass blower/designer, [employer]  Social Needs  . Financial resource strain: Not on file  . Food insecurity    Worry: Not on file    Inability: Not on file  . Transportation needs    Medical: Not on file   Non-medical: Not on file  Tobacco Use  . Smoking status: Never Smoker  . Smokeless tobacco: Never Used  Substance and Sexual Activity  . Alcohol use: No  . Drug use: No  . Sexual activity: Not on file  Lifestyle  . Physical activity    Days per week: Not on file    Minutes per session: Not on file  . Stress: Not on file  Relationships  . Social Herbalist on phone: Not on file    Gets together: Not on file    Attends religious service: Not on file    Active member of club or organization: Not on file    Attends meetings of clubs or organizations: Not on file    Relationship status: Not on file  . Intimate partner violence    Fear of current or ex partner: Not on file    Emotionally abused: Not on file    Physically abused: Not on file    Forced sexual activity: Not on file  Other Topics Concern  . Not on file  Social History Narrative   Married w/ Macelynn Gabehart, daughter in law of Lotoya Treglia    Daughter used to live in [place], she is back and lives w/ her      Family History  Problem Relation Age of Onset  . Colon  cancer Father 74       x2 69's 74  . Diabetes Father   . Heart attack Father 64  . Stroke Father 81  . Hyperlipidemia Mother   . Osteoarthritis Mother   . Melanoma Mother   . Breast cancer Neg Hx   . Esophageal cancer Neg Hx   . Pancreatic cancer Neg Hx   . Rectal cancer Neg Hx   . Stomach cancer Neg Hx      Allergies as of 08/04/2019      Reactions   Erythromycin Other (See Comments)   REACTION: Confusion   Sulfonamide Derivatives Other (See Comments)   Pt reported sisters x 4 have had reaction to SULFA, so pt's DR. in [place] said to avoid taking SULFA.   Tape Rash      Medication List       Accurate as of August 04, 2019 11:59 PM. If you have any questions, ask your nurse or doctor.        STOP taking these medications   calcium carbonate 1250 MG capsule Stopped by: Kathlene November, MD   Turmeric 450 MG Caps Stopped by: Kathlene November, MD      TAKE these medications   albuterol 108 (90 Base) MCG/ACT inhaler Commonly known as: VENTOLIN HFA Inhale into the lungs every 6 (six) hours as needed for wheezing or shortness of breath.   cholecalciferol 1000 units tablet Commonly known as: VITAMIN D Take 1,000 Units by mouth daily.   cyanocobalamin 100 MCG tablet Take 100 mcg by mouth daily.   multivitamin per tablet Take 1 tablet by mouth daily.   naproxen sodium 220 MG tablet Commonly known as: ALEVE Take 220 mg by mouth daily as needed.   polyethylene glycol 17 g packet Commonly known as: MIRALAX / GLYCOLAX Take 17 g by mouth daily as needed.   traMADol 50 MG tablet Commonly known as: Ultram Take 1 tablet (50 mg total) by mouth every 6 (six) hours as needed.           Objective:   Physical Exam BP (!) 121/39 (BP Location: Left Arm, Patient Position: Sitting, Cuff Size: Normal)   Pulse 68   Temp (!) 96.2 F (35.7 C) (Temporal)   Resp 16   Ht 5\' 6"  (1.676 m)   Wt 189 lb (85.7 kg)   SpO2 99%   BMI 30.51 kg/m  General: Well developed, NAD, BMI noted Neck: No  thyromegaly  HEENT:  Normocephalic . Face symmetric, atraumatic Lungs:  CTA B Normal respiratory effort, no intercostal retractions, no accessory muscle use. Heart: RRR,  no murmur.  No pretibial edema bilaterally  Abdomen:  Not distended, soft, non-tender. No rebound or rigidity.   Skin: Exposed areas without rash. Not pale. Not jaundice Neurologic:  alert & oriented X3.  Speech normal, gait appropriate for age and unassisted Strength symmetric and appropriate for age.  Psych: Cognition and judgment appear intact.  Cooperative with normal attention span and concentration.  Behavior appropriate. No anxious or depressed appearing.     Assessment     Assessment  Asthma MSK -CTS B -DJD  -hands, saw ortho before, had XRs -spine stenosis, Dx per Dr Nelva Bush via MRI 11-2016 ( Mild spinal stenosis L4-5. Grade 1 anterior slip with severe  facet degeneratio) Menopause -- onset ~ 58 y/o  PLAN Here for CPX Asthma: Hardly ever uses inhaler MSK: Takes tramadol sporadically Snoring: Epworth scale 4, she is really not interested on the sleep study, we agree on  monitor her symptoms. RTC 1 year

## 2019-08-04 NOTE — Progress Notes (Signed)
Pre visit review using our clinic review tool, if applicable. No additional management support is needed unless otherwise documented below in the visit note. 

## 2019-08-04 NOTE — Patient Instructions (Signed)
GO TO THE LAB : Get the blood work     GO TO THE FRONT DESK Schedule your next appointment   for a physical exam in 1 year 

## 2019-08-04 NOTE — Progress Notes (Signed)
Has been snoring really bad Never wakes up from sleep Does feel tired during the day Has hx of seasonal allergies takes allergy medication daily (unsure of name) Not interested in sleep study     Rarely uses inhaler Had bilateral carpel tunnel surgeries, feels better now Mammogram in may, going back in 2 weeks Colonoscopy 2022 Will schedule appointment with gyn Flue shot in September

## 2019-08-05 NOTE — Assessment & Plan Note (Signed)
-  Td 07-2016; pnm shot 2017, prevnar:07-2015; declined shingrix for now; had a  flu shot   -Female care per gyn: declined referral for now MMG:f/u schedule in 2 weeks  DEXA, never done, will discuss on RTC.  On vitamin D.  Follows a calcium rich diet. -CCS:  cscope 11-2010 , tics; cscope 08-2016, 1 polyp, next 2022 per letter  -Labs: CMP, FLP, CBC. -Exercise: Taking a walk 3 times a week, counseled about diet

## 2019-08-05 NOTE — Assessment & Plan Note (Signed)
Here for CPX Asthma: Hardly ever uses inhaler MSK: Takes tramadol sporadically Snoring: Epworth scale 4, she is really not interested on the sleep study, we agree on monitor her symptoms. RTC 1 year

## 2019-08-18 ENCOUNTER — Other Ambulatory Visit: Payer: Self-pay

## 2019-08-18 ENCOUNTER — Ambulatory Visit
Admission: RE | Admit: 2019-08-18 | Discharge: 2019-08-18 | Disposition: A | Discharge status: Home / Self Care | Payer: BC Managed Care – PPO | Source: Ambulatory Visit | Attending: Internal Medicine | Admitting: Internal Medicine

## 2019-08-18 DIAGNOSIS — N6002 Solitary cyst of left breast: Secondary | ICD-10-CM

## 2019-08-18 DIAGNOSIS — N6321 Unspecified lump in the left breast, upper outer quadrant: Secondary | ICD-10-CM | POA: Diagnosis not present

## 2019-08-25 ENCOUNTER — Other Ambulatory Visit: Payer: Self-pay

## 2019-08-26 ENCOUNTER — Encounter: Payer: Self-pay | Admitting: Family Medicine

## 2019-08-26 ENCOUNTER — Encounter: Payer: Self-pay | Admitting: Internal Medicine

## 2019-08-26 ENCOUNTER — Ambulatory Visit (INDEPENDENT_AMBULATORY_CARE_PROVIDER_SITE_OTHER): Payer: BC Managed Care – PPO | Admitting: Family Medicine

## 2019-08-26 VITALS — BP 120/82 | HR 68 | Temp 96.2°F | Ht 66.0 in | Wt 188.2 lb

## 2019-08-26 DIAGNOSIS — L600 Ingrowing nail: Secondary | ICD-10-CM | POA: Diagnosis not present

## 2019-08-26 NOTE — Patient Instructions (Addendum)
When you do wash it, use only soap and water. Do not vigorously scrub. Apply triple antibiotic ointment (like Neosporin) twice daily. Keep the area clean and dry.   Things to look out for: increasing pain not relieved by ibuprofen/acetaminophen, fevers, spreading redness, drainage of pus, or foul odor.  Twice daily soaks in warm water and betadine (iodine) for 10-15 minutes.   Ibuprofen 400-600 mg (2-3 over the counter strength tabs) every 6 hours as needed for pain.  OK to take Tylenol 1000 mg (2 extra strength tabs) or 975 mg (3 regular strength tabs) every 6 hours as needed.   Let us know if you need anything.

## 2019-08-26 NOTE — Progress Notes (Signed)
Chief Complaint  Patient presents with  . Ingrown Toenail    Paula Leblanc is a 58 y.o. female here for a skin complaint.  Duration: 1 month Location: R great toe digging into the second toe Pruritic? No Painful? Yes Drainage? No Therapies tried thus far: Tried trimming and filing the nail without relief.   ROS:  Const: No fevers Skin: As noted in HPI  Past Medical History:  Diagnosis Date  . Allergy   . Carpal tunnel syndrome, bilateral   . Cataract    bil cataracts removed  . Hx of adenomatous polyp of colon 09/26/2016  . Intrinsic asthma 04/15/2015  . Osteoarthritis of both hands   . PONV (postoperative nausea and vomiting)     BP 120/82 (BP Location: Left Arm, Patient Position: Sitting, Cuff Size: Normal)   Pulse 68   Temp (!) 96.2 F (35.7 C) (Temporal)   Ht 5\' 6"  (1.676 m)   Wt 188 lb 4 oz (85.4 kg)   SpO2 96%   BMI 30.38 kg/m  Gen: awake, alert, appearing stated age Lungs: No accessory muscle use Skin: Left great toenail deformed though no pain.  Right great toenail with some onycholysis distally.  There is no hyperkeratinization or deformity.  There is no erythema or fluctuance of the nail pulp.  There are some chronic irritation changes on the medial second digit distally. Psych: Age appropriate judgment and insight  Procedure note; partial toenail removal Informed consent obtained. The area was anesthetized with a four corner ring block using 1.5 mL of 2% lidocaine without epinephrine in each quadrant, a total of 6 mL. A tourniquet was placed around the base of the toe. Several minutes past while the initial anesthesia started to work. Straight hemostat used to elevate the lateral portion of the nail, scissors used to cut the nail.  Hemostat used to break the nail free.  Phenol was used and the exposed nail matrix. Adequate hemostasis was obtained after the tourniquet was removed. TAO placed followed by dressing.  The patient tolerated the procedure  well. There were no complications noted.   Ingrown toenail - Plan: PR REMOVAL OF NAIL PLATE  Tylenol, ibuprofen, ice, Betadine soaks twice daily, tramadol for breakthrough pain.  Warning signs and symptoms verbalized and written down.  Aftercare instructions verbalized and written down.  I will see her in 1 week to recheck her toe. The patient voiced understanding and agreement to the plan.  [place], DO 08/26/19 11:59 AM

## 2019-09-02 ENCOUNTER — Ambulatory Visit: Payer: BC Managed Care – PPO | Admitting: Internal Medicine

## 2019-12-14 ENCOUNTER — Encounter: Payer: Self-pay | Admitting: Internal Medicine

## 2020-02-26 ENCOUNTER — Encounter: Payer: Self-pay | Admitting: Internal Medicine

## 2020-03-01 ENCOUNTER — Ambulatory Visit: Payer: BC Managed Care – PPO | Admitting: Internal Medicine

## 2020-03-09 ENCOUNTER — Ambulatory Visit (INDEPENDENT_AMBULATORY_CARE_PROVIDER_SITE_OTHER): Payer: BC Managed Care – PPO | Admitting: Internal Medicine

## 2020-03-09 ENCOUNTER — Encounter: Payer: Self-pay | Admitting: Internal Medicine

## 2020-03-09 ENCOUNTER — Other Ambulatory Visit: Payer: Self-pay

## 2020-03-09 VITALS — BP 116/39 | HR 64 | Temp 96.2°F | Resp 16 | Ht 66.0 in | Wt 187.0 lb

## 2020-03-09 DIAGNOSIS — R0683 Snoring: Secondary | ICD-10-CM

## 2020-03-09 DIAGNOSIS — Z0189 Encounter for other specified special examinations: Secondary | ICD-10-CM

## 2020-03-09 DIAGNOSIS — G4489 Other headache syndrome: Secondary | ICD-10-CM

## 2020-03-09 NOTE — Progress Notes (Signed)
Subjective:    Patient ID: Paula Leblanc, female    DOB: 11/13/1960, 59 y.o.   MRN: [id number]  DOS:  03/09/2020 Type of visit - description: Acute visit  Her main concern is sleep apnea. Her husband is telling her that she is snoring more for the last few months. She has developed a frontal headache which is episodic. Interestingly she started to take Zyrtec 6 months ago.  Wt Readings from Last 3 Encounters:  03/09/20 187 lb (84.8 kg)  08/26/19 188 lb 4 oz (85.4 kg)  08/04/19 189 lb (85.7 kg)     Review of Systems Some allergy symptoms on and off including itchy eyes and sinus congestion. Denies sneezing.  Past Medical History:  Diagnosis Date   Allergy    Carpal tunnel syndrome, bilateral    Cataract    bil cataracts removed   Hx of adenomatous polyp of colon 09/26/2016   Intrinsic asthma 04/15/2015   Osteoarthritis of both hands    PONV (postoperative nausea and vomiting)     Past Surgical History:  Procedure Laterality Date   BREAST EXCISIONAL BIOPSY Left    CARPAL TUNNEL RELEASE Right 08/19/2018   Procedure: RIGHT CARPAL TUNNEL RELEASE;  Surgeon: Daryll Brod, MD;  Location: [place];  Service: Orthopedics;  Laterality: Right;   CARPAL TUNNEL RELEASE Left 12/09/2018   Procedure: CARPAL TUNNEL RELEASE;  Surgeon: Daryll Brod, MD;  Location: [place];  Service: Orthopedics;  Laterality: Left;   CATARACT EXTRACTION Bilateral    L 2004, R 2006   CHOLECYSTECTOMY     cyst removed left breast     TRIGGER FINGER RELEASE Right 08/19/2018   Procedure: RELEASE RIGHT INDEX TRIGGER FINGER/A-1 PULLEY;  Surgeon: Daryll Brod, MD;  Location: [facility];  Service: Orthopedics;  Laterality: Right;   WISDOM TOOTH EXTRACTION      Allergies as of 03/09/2020      Reactions   Erythromycin Other (See Comments)   REACTION: Confusion   Sulfonamide Derivatives Other (See Comments)   Pt reported sisters x 4 have had reaction  to SULFA, so pt's DR. in [place] said to avoid taking SULFA.   Tape Rash      Medication List       Accurate as of March 09, 2020 11:59 PM. If you have any questions, ask your nurse or doctor.        albuterol 108 (90 Base) MCG/ACT inhaler Commonly known as: VENTOLIN HFA Inhale into the lungs every 6 (six) hours as needed for wheezing or shortness of breath.   azelastine 0.1 % nasal spray Commonly known as: ASTELIN Place 1-2 sprays into both nostrils 2 (two) times daily as needed for rhinitis or allergies. Use in each nostril as directed   cholecalciferol 1000 units tablet Commonly known as: VITAMIN D Take 1,000 Units by mouth daily.   cyanocobalamin 100 MCG tablet Take 100 mcg by mouth daily.   multivitamin per tablet Take 1 tablet by mouth daily.   naproxen sodium 220 MG tablet Commonly known as: ALEVE Take 220 mg by mouth daily as needed.   polyethylene glycol 17 g packet Commonly known as: MIRALAX / GLYCOLAX Take 17 g by mouth daily as needed.   traMADol 50 MG tablet Commonly known as: Ultram Take 1 tablet (50 mg total) by mouth every 6 (six) hours as needed.          Objective:   Physical Exam BP (!) 116/39 (BP Location: Left Arm,  Patient Position: Sitting, Cuff Size: Small)    Pulse 64    Temp (!) 96.2 F (35.7 C) (Temporal)    Resp 16    Ht 5\' 6"  (1.676 m)    Wt 187 lb (84.8 kg)    SpO2 97%    BMI 30.18 kg/m  General:   Well developed, NAD, BMI noted. HEENT:  Normocephalic . Face symmetric, atraumatic. TM slightly bulged, throat symmetric, not red, not crowded Lungs:  CTA B Normal respiratory effort, no intercostal retractions, no accessory muscle use. Heart: RRR,  no murmur.  Lower extremities: no pretibial edema bilaterally  Skin: Not pale. Not jaundice Neurologic:  alert & oriented X3.  Speech normal, gait appropriate for age and unassisted Psych--  Cognition and judgment appear intact.  Cooperative with normal attention span and  concentration.  Behavior appropriate. No anxious or depressed appearing.      Assessment       Assessment  Asthma MSK -CTS B -DJD  -hands, saw ortho before, had XRs -spine stenosis, Dx per Dr Nelva Bush via MRI 11-2016 ( Mild spinal stenosis L4-5. Grade 1 anterior slip with severe facet degeneratio) Menopause -- onset ~ 59 y/o  PLAN Snoring: Since the last office visit she is snoring more, her Epworth scale is now 9 thus considered positive. No weight gain.  Interestingly symptoms coincide with taking Zyrtec every morning. Plan: Stop Zyrtec, use instead Astelin and or Flonase. Refer to neurology for further evaluation regards to snoring. Allergies: As above   This visit occurred during the SARS-CoV-2 public health emergency.  Safety protocols were in place, including screening questions prior to the visit, additional usage of staff PPE, and extensive cleaning of exam room while observing appropriate contact time as indicated for disinfecting solutions.

## 2020-03-09 NOTE — Patient Instructions (Signed)
Stop Zyrtec  Okay to take over-the-counter Flonase and/or Astelin

## 2020-03-09 NOTE — Progress Notes (Signed)
Pre visit review using our clinic review tool, if applicable. No additional management support is needed unless otherwise documented below in the visit note. 

## 2020-03-10 NOTE — Assessment & Plan Note (Signed)
Snoring: Since the last office visit she is snoring more, her Epworth scale is now 9 thus considered positive. No weight gain.  Interestingly symptoms coincide with taking Zyrtec every morning. Plan: Stop Zyrtec, use instead Astelin and or Flonase. Refer to neurology for further evaluation regards to snoring. Allergies: As above

## 2020-03-11 DIAGNOSIS — Z961 Presence of intraocular lens: Secondary | ICD-10-CM | POA: Diagnosis not present

## 2020-03-30 ENCOUNTER — Ambulatory Visit (INDEPENDENT_AMBULATORY_CARE_PROVIDER_SITE_OTHER): Payer: BC Managed Care – PPO | Admitting: Neurology

## 2020-03-30 ENCOUNTER — Encounter: Payer: Self-pay | Admitting: Neurology

## 2020-03-30 ENCOUNTER — Other Ambulatory Visit: Payer: Self-pay

## 2020-03-30 VITALS — BP 117/72 | HR 62 | Ht 66.0 in | Wt 188.0 lb

## 2020-03-30 DIAGNOSIS — R0683 Snoring: Secondary | ICD-10-CM | POA: Diagnosis not present

## 2020-03-30 DIAGNOSIS — G4719 Other hypersomnia: Secondary | ICD-10-CM

## 2020-03-30 DIAGNOSIS — E669 Obesity, unspecified: Secondary | ICD-10-CM

## 2020-03-30 DIAGNOSIS — R519 Headache, unspecified: Secondary | ICD-10-CM | POA: Diagnosis not present

## 2020-03-30 DIAGNOSIS — R351 Nocturia: Secondary | ICD-10-CM | POA: Diagnosis not present

## 2020-03-30 NOTE — Patient Instructions (Signed)

## 2020-03-30 NOTE — Progress Notes (Signed)
Subjective:    Patient ID: Paula Leblanc is a 59 y.o. female.  HPI     Star Age, MD, PhD [facility] [address] [place], [place] [address]  Dear Dr. Larose Kells,   I saw your patient, Paula Leblanc, upon your kind request some sick clinic today for initial consultation of her sleep disorder, in particular, concern for underlying obstructive sleep apnea.  The patient is unaccompanied today.  As you know, Paula Leblanc is a 59 year old right-handed woman with an underlying medical history of osteoarthritis, L spine stenosis, asthma, cataracts, carpal tunnel, allergies, and borderline obesity, who reports snoring and excessive daytime somnolence as well as witnessed apneas (per husband's report).  I reviewed your office note from 03/09/2020.  Her Epworth sleepiness score is 13/24, fatigue severity score is 25.63. She has had some recurrent headaches in the front, headaches have improved in the past week a little bit.  She reports that she has significant seasonal allergies and has been taking a generic version of Zyrtec.  She has been encouraged to stop it when she saw you.  She has a history of asthma, it is typically well controlled, she was diagnosed as an adult.  She has a history of back pain and lumbar spinal stenosis, had seen Dr. Nelva Bush for this and had physical therapy but stopped because her pain got worse. Bedtime is generally between 10 and 1030 and rise time between 5 and 7.  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  She is married and lives with her husband and daughter and daughter's boyfriend currently staying with them, her daughter will be moving later this year.  Patient does not watch TV in the bedroom.  She used to have 2 cats.  She is a non-smoker, she drinks alcohol rarely, drinks caffeine in the form of coffee, 2 cups in the mornings and at the most 1 diet soda per day.  She has nocturia about once per average night.  She is not aware of  any family history of OSA.  Her Past Medical History Is Significant For: Past Medical History:  Diagnosis Date  . Allergy   . Carpal tunnel syndrome, bilateral   . Cataract    bil cataracts removed  . Hx of adenomatous polyp of colon 09/26/2016  . Intrinsic asthma 04/15/2015  . Osteoarthritis of both hands   . PONV (postoperative nausea and vomiting)   . Seasonal allergies     Her Past Surgical History Is Significant For: Past Surgical History:  Procedure Laterality Date  . BREAST EXCISIONAL BIOPSY Left   . CARPAL TUNNEL RELEASE Right 08/19/2018   Procedure: RIGHT CARPAL TUNNEL RELEASE;  Surgeon: Daryll Brod, MD;  Location: [facility];  Service: Orthopedics;  Laterality: Right;  . CARPAL TUNNEL RELEASE Left 12/09/2018   Procedure: CARPAL TUNNEL RELEASE;  Surgeon: Daryll Brod, MD;  Location: [facility];  Service: Orthopedics;  Laterality: Left;  . CATARACT EXTRACTION Bilateral    L 2004, R 2006  . CHOLECYSTECTOMY    . cyst removed left breast    . TRIGGER FINGER RELEASE Right 08/19/2018   Procedure: RELEASE RIGHT INDEX TRIGGER FINGER/A-1 PULLEY;  Surgeon: Daryll Brod, MD;  Location: [facility];  Service: Orthopedics;  Laterality: Right;  . WISDOM TOOTH EXTRACTION      Her Family History Is Significant For: Family History  Problem Relation Age of Onset  . Colon cancer Father 13  x2 80's 6  . Diabetes Father   . Heart attack Father 27  . Stroke Father 66  . Hyperlipidemia Mother   . Osteoarthritis Mother   . Melanoma Mother   . Breast cancer Neg Hx   . Esophageal cancer Neg Hx   . Pancreatic cancer Neg Hx   . Rectal cancer Neg Hx   . Stomach cancer Neg Hx     Her Social History Is Significant For: Social History   Socioeconomic History  . Marital status: Married    Spouse name: Not on file  . Number of children: 1  . Years of education: Not on file  . Highest education level: Not on file  Occupational History   . Occupation: Glass blower/designer, law firm  Tobacco Use  . Smoking status: Never Smoker  . Smokeless tobacco: Never Used  Vaping Use  . Vaping Use: Never used  Substance and Sexual Activity  . Alcohol use: No  . Drug use: No  . Sexual activity: Not on file  Other Topics Concern  . Not on file  Social History Narrative   Married w/ Almond Lint, daughter in law of Mailynn Everly    Daughter used to live in [place], she is back and lives w/ her    Social Determinants of Health   Financial Resource Strain:   . Difficulty of Paying Living Expenses:   Food Insecurity:   . Worried About Charity fundraiser in the Last Year:   . Arboriculturist in the Last Year:   Transportation Needs:   . Film/video editor (Medical):   Marland Kitchen Lack of Transportation (Non-Medical):   Physical Activity:   . Days of Exercise per Week:   . Minutes of Exercise per Session:   Stress:   . Feeling of Stress :   Social Connections:   . Frequency of Communication with Friends and Family:   . Frequency of Social Gatherings with Friends and Family:   . Attends Religious Services:   . Active Member of Clubs or Organizations:   . Attends Archivist Meetings:   Marland Kitchen Marital Status:     Her Allergies Are:  Allergies  Allergen Reactions  . Erythromycin Other (See Comments)    REACTION: Confusion  . Sulfonamide Derivatives Other (See Comments)    Pt reported sisters x 4 have had reaction to SULFA, so pt's DR. in [place] said to avoid taking SULFA.  . Tape Rash  :   Her Current Medications Are:  Outpatient Encounter Medications as of 03/30/2020  Medication Sig  . albuterol (PROVENTIL HFA;VENTOLIN HFA) 108 (90 Base) MCG/ACT inhaler Inhale into the lungs every 6 (six) hours as needed for wheezing or shortness of breath.  Marland Kitchen azelastine (ASTELIN) 0.1 % nasal spray Place 1-2 sprays into both nostrils 2 (two) times daily as needed for rhinitis or allergies. Use in each nostril as directed  . cholecalciferol  (VITAMIN D) 1000 units tablet Take 1,000 Units by mouth daily.  . cyanocobalamin 100 MCG tablet Take 100 mcg by mouth daily.    . multivitamin (THERAGRAN) per tablet Take 1 tablet by mouth daily.    . naproxen sodium (ALEVE) 220 MG tablet Take 220 mg by mouth daily as needed.  . polyethylene glycol (MIRALAX / GLYCOLAX) packet Take 17 g by mouth daily as needed.  . [DISCONTINUED] traMADol (ULTRAM) 50 MG tablet Take 1 tablet (50 mg total) by mouth every 6 (six) hours as needed. (Patient not taking: Reported on  03/30/2020)   No facility-administered encounter medications on file as of 03/30/2020.  :  Review of Systems:  Out of a complete 14 point review of systems, all are reviewed and negative with the exception of these symptoms as listed below: Review of Systems  Neurological:       Here for sleep consult. No prior sleep study, snoring is present.  Epworth Sleepiness Scale 0= would never doze 1= slight chance of dozing 2= moderate chance of dozing 3= high chance of dozing  Sitting and reading: Watching TV: Sitting inactive in a public place (ex. Theater or meeting): As a passenger in a car for an hour without a break: Lying down to rest in the afternoon: Sitting and talking to someone: Sitting quietly after lunch (no alcohol): In a car, while stopped in traffic: Total:     Objective:  Neurological Exam  Physical Exam Physical Examination:   Vitals:   03/30/20 0900  BP: 117/72  Pulse: 62    General Examination: The patient is a very pleasant 59 y.o. female in no acute distress. She appears well-developed and well-nourished and well groomed.   HEENT: Normocephalic, atraumatic, pupils are equal, round and reactive to light, extraocular tracking is good without limitation to gaze excursion or nystagmus noted. Hearing is grossly intact. Face is symmetric with normal facial animation. Speech is clear with no dysarthria noted. There is no hypophonia. There is no lip, neck/head,  jaw or voice tremor. Neck is supple with full range of passive and active motion. There are no carotid bruits on auscultation. Oropharynx exam reveals: mild mouth dryness, adequate dental hygiene and mild airway crowding, due to slightly prominent uvula. Mallampati is class I. Tongue protrudes centrally and palate elevates symmetrically. Tonsils are small ?absent. Neck size is 16.5 inches.   Chest: Clear to auscultation without wheezing, rhonchi or crackles noted.  Heart: S1+S2+0, regular and normal without murmurs, rubs or gallops noted.   Abdomen: Soft, non-tender and non-distended with normal bowel sounds appreciated on auscultation.  Extremities: There is no pitting edema in the distal lower extremities bilaterally.   Skin: Warm and dry without trophic changes noted.   Musculoskeletal: exam reveals arthritic changes in L>R hand.   Neurologically:  Mental status: The patient is awake, alert and oriented in all 4 spheres. Her immediate and remote memory, attention, language skills and fund of knowledge are appropriate. There is no evidence of aphasia, agnosia, apraxia or anomia. Speech is clear with normal prosody and enunciation. Thought process is linear. Mood is normal and affect is normal.  Cranial nerves II - XII are as described above under HEENT exam.  Motor exam: Normal bulk, strength and tone is noted. There is no tremor, Fine motor skills and coordination: grossly intact.  Cerebellar testing: No dysmetria or intention tremor. There is no truncal or gait ataxia.  Sensory exam: intact to light touch in the upper and lower extremities.  Gait, station and balance: She stands easily. No veering to one side is noted. No leaning to one side is noted. Posture is age-appropriate and stance is narrow based. Gait shows normal stride length and normal pace. No problems turning are noted.   Assessment and Plan:    In summary, KOLETTE VEY is a very pleasant 59 y.o.-year old female with an  underlying medical history of osteoarthritis, L spine stenosis, asthma, cataracts, carpal tunnel, allergies, and borderline obesity, whose history and physical exam are concerning for obstructive sleep apnea (OSA). I had a long chat with  the patient about my findings and the diagnosis of OSA, its prognosis and treatment options. We talked about medical treatments, surgical interventions and non-pharmacological approaches. I explained in particular the risks and ramifications of untreated moderate to severe OSA, especially with respect to developing cardiovascular disease down the Road, including congestive heart failure, difficult to treat hypertension, cardiac arrhythmias, or stroke. Even type 2 diabetes has, in part, been linked to untreated OSA. Symptoms of untreated OSA include daytime sleepiness, memory problems, mood irritability and mood disorder such as depression and anxiety, lack of energy, as well as recurrent headaches, especially morning headaches. We talked about trying to maintain a healthy lifestyle in general, as well as the importance of weight control. We also talked about the importance of good sleep hygiene. I recommended the following at this time: sleep study.  I explained the sleep test procedure to the patient and also outlined possible surgical and non-surgical treatment options of OSA, including the use of a custom-made dental device (which would require a referral to a specialist dentist or oral surgeon), upper airway surgical options, such as traditional UPPP or a novel less invasive surgical option in the form of Inspire hypoglossal nerve stimulation (which would involve a referral to an ENT surgeon). I also explained the CPAP treatment option to the patient, who indicated that she would be willing to try CPAP if the need arises. I explained the importance of being compliant with PAP treatment, not only for insurance purposes but primarily to improve Her symptoms, and for the  patient's long term health benefit, including to reduce Her cardiovascular risks. I answered all her questions today and the patient was in agreement. I plan to see her back after the sleep study is completed and encouraged her to call with any interim questions, concerns, problems or updates.   Thank you very much for allowing me to participate in the care of this nice patient. If I can be of any further assistance to you please do not hesitate to call me at [phone].  Sincerely,   Star Age, MD, PhD

## 2020-04-13 ENCOUNTER — Telehealth: Payer: Self-pay

## 2020-04-13 NOTE — Telephone Encounter (Signed)
LVM for pt to call me back to schedule sleep study

## 2020-04-15 ENCOUNTER — Encounter: Payer: Self-pay | Admitting: Internal Medicine

## 2020-07-05 ENCOUNTER — Other Ambulatory Visit: Payer: Self-pay | Admitting: Internal Medicine

## 2020-07-05 DIAGNOSIS — N6320 Unspecified lump in the left breast, unspecified quadrant: Secondary | ICD-10-CM

## 2020-07-28 DIAGNOSIS — Z01419 Encounter for gynecological examination (general) (routine) without abnormal findings: Secondary | ICD-10-CM | POA: Diagnosis not present

## 2020-07-28 DIAGNOSIS — Z6830 Body mass index (BMI) 30.0-30.9, adult: Secondary | ICD-10-CM | POA: Diagnosis not present

## 2020-08-02 LAB — HM PAP SMEAR: HM Pap smear: NEGATIVE

## 2020-08-12 ENCOUNTER — Encounter: Payer: Self-pay | Admitting: Internal Medicine

## 2020-08-13 ENCOUNTER — Encounter: Payer: Self-pay | Admitting: Internal Medicine

## 2020-08-17 ENCOUNTER — Encounter: Payer: Self-pay | Admitting: Internal Medicine

## 2020-08-19 ENCOUNTER — Other Ambulatory Visit: Payer: Self-pay

## 2020-08-19 ENCOUNTER — Ambulatory Visit
Admission: RE | Admit: 2020-08-19 | Discharge: 2020-08-19 | Disposition: A | Discharge status: Home / Self Care | Payer: BC Managed Care – PPO | Source: Ambulatory Visit | Attending: Internal Medicine | Admitting: Internal Medicine

## 2020-08-19 DIAGNOSIS — N6321 Unspecified lump in the left breast, upper outer quadrant: Secondary | ICD-10-CM | POA: Diagnosis not present

## 2020-08-19 DIAGNOSIS — N6320 Unspecified lump in the left breast, unspecified quadrant: Secondary | ICD-10-CM

## 2020-09-13 DIAGNOSIS — K5792 Diverticulitis of intestine, part unspecified, without perforation or abscess without bleeding: Secondary | ICD-10-CM | POA: Insufficient documentation

## 2020-10-05 ENCOUNTER — Ambulatory Visit (INDEPENDENT_AMBULATORY_CARE_PROVIDER_SITE_OTHER): Payer: BC Managed Care – PPO | Admitting: Internal Medicine

## 2020-10-05 ENCOUNTER — Encounter: Payer: Self-pay | Admitting: Internal Medicine

## 2020-10-05 ENCOUNTER — Other Ambulatory Visit: Payer: Self-pay

## 2020-10-05 VITALS — BP 120/75 | HR 65 | Temp 98.1°F | Resp 16 | Ht 66.0 in | Wt 190.4 lb

## 2020-10-05 DIAGNOSIS — Z0000 Encounter for general adult medical examination without abnormal findings: Secondary | ICD-10-CM | POA: Diagnosis not present

## 2020-10-05 DIAGNOSIS — L600 Ingrowing nail: Secondary | ICD-10-CM | POA: Diagnosis not present

## 2020-10-05 LAB — CBC WITH DIFFERENTIAL/PLATELET
Basophils Absolute: 0.1 10*3/uL (ref 0.0–0.1)
Basophils Relative: 0.9 % (ref 0.0–3.0)
Eosinophils Absolute: 0.2 10*3/uL (ref 0.0–0.7)
Eosinophils Relative: 3.7 % (ref 0.0–5.0)
HCT: 43.1 % (ref 36.0–46.0)
Hemoglobin: 14 g/dL (ref 12.0–15.0)
Lymphocytes Relative: 21.5 % (ref 12.0–46.0)
Lymphs Abs: 1.3 10*3/uL (ref 0.7–4.0)
MCHC: 32.5 g/dL (ref 30.0–36.0)
MCV: 89.3 fl (ref 78.0–100.0)
Monocytes Absolute: 0.4 10*3/uL (ref 0.1–1.0)
Monocytes Relative: 7 % (ref 3.0–12.0)
Neutro Abs: 4.1 10*3/uL (ref 1.4–7.7)
Neutrophils Relative %: 66.9 % (ref 43.0–77.0)
Platelets: 205 10*3/uL (ref 150.0–400.0)
RBC: 4.83 Mil/uL (ref 3.87–5.11)
RDW: 13.4 % (ref 11.5–15.5)
WBC: 6.1 10*3/uL (ref 4.0–10.5)

## 2020-10-05 LAB — COMPREHENSIVE METABOLIC PANEL
ALT: 28 U/L (ref 0–35)
AST: 17 U/L (ref 0–37)
Albumin: 4.3 g/dL (ref 3.5–5.2)
Alkaline Phosphatase: 69 U/L (ref 39–117)
BUN: 17 mg/dL (ref 6–23)
CO2: 34 mEq/L — ABNORMAL HIGH (ref 19–32)
Calcium: 9.2 mg/dL (ref 8.4–10.5)
Chloride: 109 mEq/L (ref 96–112)
Creatinine, Ser: 0.59 mg/dL (ref 0.40–1.20)
GFR: 96.97 mL/min (ref >60.00)
Glucose, Bld: 97 mg/dL (ref 70–99)
Potassium: 4.4 mEq/L (ref 3.5–5.1)
Sodium: 147 mEq/L — ABNORMAL HIGH (ref 135–145)
Total Bilirubin: 0.5 mg/dL (ref 0.2–1.2)
Total Protein: 6.7 g/dL (ref 6.0–8.3)

## 2020-10-05 LAB — LIPID PANEL
Cholesterol: 230 mg/dL — ABNORMAL HIGH (ref 0–200)
HDL: 41.5 mg/dL (ref >39.00)
LDL Cholesterol: 154 mg/dL — ABNORMAL HIGH (ref 0–99)
NonHDL: 188.78
Total CHOL/HDL Ratio: 6
Triglycerides: 173 mg/dL — ABNORMAL HIGH (ref 0.0–149.0)
VLDL: 34.6 mg/dL (ref 0.0–40.0)

## 2020-10-05 LAB — HEMOGLOBIN A1C: Hgb A1c MFr Bld: 5.8 % (ref 4.6–6.5)

## 2020-10-05 NOTE — Patient Instructions (Signed)
We are referring you to the podiatrist for the ingrown toenails  Please consider proceed with a sleep study    GO TO THE LAB : Get the blood work     GO TO THE FRONT DESK, PLEASE SCHEDULE YOUR APPOINTMENTS Come back for   a physical exam in 1 year

## 2020-10-05 NOTE — Assessment & Plan Note (Signed)
Here for CPX Asthma: Uses inhaler rarely MSK: History of spinal stenosis, takes NSAIDs 3 times a week on average, GI precautions discussed, reports her back pain is slightly worse lately, I noticed some difficulty transferring, plans to see Dr. Ethelene Hal.  Encouraged to do that. Snoring Saw neuro, in-house sleep test not covered by insurance, pt decided not to do the home sleep test.  Overall sleeping better since she has stopped OTC oral antihistaminic, less snoring and less morning HAs, still feel occ fatigue but less than before. Advised about CV RF w/ underlying OSA. rec to consider be tested . Ingrown toenails,recurrent, referred to podiatry. RTC 1 year

## 2020-10-05 NOTE — Assessment & Plan Note (Signed)
-  Td 07-2016 - pnm shot 2017, prevnar:07-2015 - shingrix: Electing to wait -covid vax x 3 -had a  flu shot   -Female care : saw gyn 07-2020  MMG: 08/2020 ([facility]) DEXA: plans to do @ gyn at next OV. On vitamin D.  Follows a calcium rich diet. -CCS:  cscope 11-2010 , tics; cscope 08-2016, 1 polyp, next 2022 per letter  -Labs: CMP, FLP, CBC, A1c. -Diet exercise: Counseled

## 2020-10-05 NOTE — Progress Notes (Signed)
Subjective:    Patient ID: Paula Leblanc, female    DOB: [birth year], 60 y.o.   MRN: [id number]  DOS:  10/05/2020 Type of visit - description: CPX  In general feeling well. Did c/o recurrent ingrown toenails. She also snoring, saw neurology but did not proceed with a home sleep study.  Review of Systems  Other than above, a 14 point review of systems is negative      Past Medical History:  Diagnosis Date  . Allergy   . Carpal tunnel syndrome, bilateral   . Cataract    bil cataracts removed  . Hx of adenomatous polyp of colon 09/26/2016  . Intrinsic asthma 04/15/2015  . Osteoarthritis of both hands   . PONV (postoperative nausea and vomiting)   . Seasonal allergies     Past Surgical History:  Procedure Laterality Date  . BREAST EXCISIONAL BIOPSY Left   . CARPAL TUNNEL RELEASE Right 08/19/2018   Procedure: RIGHT CARPAL TUNNEL RELEASE;  Surgeon: Cindee Salt, MD;  Location: [facility];  Service: Orthopedics;  Laterality: Right;  . CARPAL TUNNEL RELEASE Left 12/09/2018   Procedure: CARPAL TUNNEL RELEASE;  Surgeon: Cindee Salt, MD;  Location: [facility];  Service: Orthopedics;  Laterality: Left;  . CATARACT EXTRACTION Bilateral    L 2004, R 2006  . CHOLECYSTECTOMY    . cyst removed left breast    . TRIGGER FINGER RELEASE Right 08/19/2018   Procedure: RELEASE RIGHT INDEX TRIGGER FINGER/A-1 PULLEY;  Surgeon: Cindee Salt, MD;  Location: [facility];  Service: Orthopedics;  Laterality: Right;  . WISDOM TOOTH EXTRACTION      Allergies as of 10/05/2020      Reactions   Erythromycin Other (See Comments)   REACTION: Confusion   Sulfonamide Derivatives Other (See Comments)   Pt reported sisters x 4 have had reaction to SULFA, so pt's DR. in [place] said to avoid taking SULFA.   Tape Rash      Medication List       Accurate as of October 05, 2020  5:57 PM. If you have any questions, ask your nurse or doctor.        albuterol 108  (90 Base) MCG/ACT inhaler Commonly known as: VENTOLIN HFA Inhale into the lungs every 6 (six) hours as needed for wheezing or shortness of breath.   azelastine 0.1 % nasal spray Commonly known as: ASTELIN Place 1-2 sprays into both nostrils 2 (two) times daily as needed for rhinitis or allergies. Use in each nostril as directed   cholecalciferol 1000 units tablet Commonly known as: VITAMIN D Take 1,000 Units by mouth daily.   cyanocobalamin 100 MCG tablet Take 100 mcg by mouth daily.   multivitamin per tablet Take 1 tablet by mouth daily.   naproxen sodium 220 MG tablet Commonly known as: ALEVE Take 220 mg by mouth daily as needed.   polyethylene glycol 17 g packet Commonly known as: MIRALAX / GLYCOLAX Take 17 g by mouth daily as needed.          Objective:   Physical Exam BP 120/75 (BP Location: Left Arm, Patient Position: Sitting, Cuff Size: Normal)   Pulse 65   Temp 98.1 F (36.7 C) (Oral)   Resp 16   Ht 5\' 6"  (1.676 m)   Wt 190 lb 6 oz (86.4 kg)   SpO2 96%   BMI 30.73 kg/m  General: Well developed, NAD, BMI noted Neck: No  thyromegaly  HEENT:  Normocephalic . Face  symmetric, atraumatic Lungs:  CTA B Normal respiratory effort, no intercostal retractions, no accessory muscle use. Heart: RRR,  no murmur.  Abdomen:  Not distended, soft, non-tender. No rebound or rigidity.   Lower extremities: no pretibial edema bilaterally  Skin: Exposed areas without rash. Not pale. Not jaundice Neurologic:  alert & oriented X3.  Speech normal, gait appropriate for age and unassisted.  I did notice some difficulties getting on and off the exam table Strength symmetric and appropriate for age.  Psych: Cognition and judgment appear intact.  Cooperative with normal attention span and concentration.  Behavior appropriate. No anxious or depressed appearing.     Assessment     Assessment  Asthma MSK -CTS B -DJD  -hands, saw ortho before, had XRs -spine stenosis, Dx  per Dr Nelva Bush via MRI 11-2016 ( Mild spinal stenosis L4-5. Grade 1 anterior slip with severe facet degeneratio) Menopause -- onset ~ 60 y/o  PLAN Here for CPX Asthma: Uses inhaler rarely MSK: History of spinal stenosis, takes NSAIDs 3 times a week on average, GI precautions discussed, reports her back pain is slightly worse lately, I noticed some difficulty transferring, plans to see Dr. Nelva Bush.  Encouraged to do that. Snoring Saw neuro, in-house sleep test not covered by insurance, pt decided not to do the home sleep test.  Overall sleeping better since she has stopped OTC oral antihistaminic, less snoring and less morning HAs, still feel occ fatigue but less than before. Advised about CV RF w/ underlying OSA. rec to consider be tested . Ingrown toenails,recurrent, referred to podiatry. RTC 1 year    This visit occurred during the SARS-CoV-2 public health emergency.  Safety protocols were in place, including screening questions prior to the visit, additional usage of staff PPE, and extensive cleaning of exam room while observing appropriate contact time as indicated for disinfecting solutions.

## 2020-10-05 NOTE — Progress Notes (Signed)
Pre visit review using our clinic review tool, if applicable. No additional management support is needed unless otherwise documented below in the visit note. 

## 2020-10-11 DIAGNOSIS — K642 Third degree hemorrhoids: Secondary | ICD-10-CM | POA: Diagnosis not present

## 2021-02-09 DIAGNOSIS — M25561 Pain in right knee: Secondary | ICD-10-CM | POA: Diagnosis not present

## 2021-02-09 DIAGNOSIS — M545 Low back pain, unspecified: Secondary | ICD-10-CM | POA: Diagnosis not present

## 2021-02-09 DIAGNOSIS — M25562 Pain in left knee: Secondary | ICD-10-CM | POA: Diagnosis not present

## 2021-02-09 DIAGNOSIS — R102 Pelvic and perineal pain: Secondary | ICD-10-CM | POA: Diagnosis not present

## 2021-02-09 DIAGNOSIS — M25552 Pain in left hip: Secondary | ICD-10-CM | POA: Diagnosis not present

## 2021-03-24 DIAGNOSIS — M160 Bilateral primary osteoarthritis of hip: Secondary | ICD-10-CM | POA: Diagnosis not present

## 2021-03-30 ENCOUNTER — Telehealth: Payer: Self-pay

## 2021-03-30 ENCOUNTER — Ambulatory Visit: Payer: BC Managed Care – PPO

## 2021-03-30 NOTE — Telephone Encounter (Signed)
Received surgical clearance form from [facility], Pt is needing a L total hip arthroplasty with Dr. Wynelle Link on 05/16/2021. Most recent EKG 2013. Will reach out to Pt to schedule surgical clearance appt.

## 2021-03-31 ENCOUNTER — Ambulatory Visit: Payer: BC Managed Care – PPO

## 2021-04-04 ENCOUNTER — Other Ambulatory Visit: Payer: Self-pay

## 2021-04-04 ENCOUNTER — Ambulatory Visit (INDEPENDENT_AMBULATORY_CARE_PROVIDER_SITE_OTHER): Payer: BC Managed Care – PPO | Admitting: Internal Medicine

## 2021-04-04 ENCOUNTER — Encounter: Payer: Self-pay | Admitting: Internal Medicine

## 2021-04-04 VITALS — BP 124/76 | HR 76 | Temp 98.5°F | Resp 18 | Ht 66.0 in | Wt 198.1 lb

## 2021-04-04 DIAGNOSIS — Z01818 Encounter for other preprocedural examination: Secondary | ICD-10-CM

## 2021-04-04 DIAGNOSIS — R0683 Snoring: Secondary | ICD-10-CM

## 2021-04-04 NOTE — Progress Notes (Signed)
Subjective:    Patient ID: Paula Leblanc, female    DOB: [birth year], 60 y.o.   MRN: [id number]  DOS:  04/04/2021 Type of visit - description: Preop evaluation Since the last office visit, she saw the orthopedic doctors, she is in need of L hip replacement. This is severely limiting her ambulation. Currently taking Aleve as needed and occasional aspirin for pain management Able to go upstairs and walk on flat surface almost without limitations as long as she does it slowly to help with the hip pain  Review of Systems Denies chest pain or difficulty breathing No nausea vomiting or diarrhea No blood in the stools No cough History of snoring, it is unchanged from previous years.  Occasionally feels fatigued.  Past Medical History:  Diagnosis Date   Allergy    Carpal tunnel syndrome, bilateral    Cataract    bil cataracts removed   Hx of adenomatous polyp of colon 09/26/2016   Intrinsic asthma 04/15/2015   Osteoarthritis of both hands    PONV (postoperative nausea and vomiting)    Seasonal allergies     Past Surgical History:  Procedure Laterality Date   BREAST EXCISIONAL BIOPSY Left    CARPAL TUNNEL RELEASE Right 08/19/2018   Procedure: RIGHT CARPAL TUNNEL RELEASE;  Surgeon: Daryll Brod, MD;  Location: [facility];  Service: Orthopedics;  Laterality: Right;   CARPAL TUNNEL RELEASE Left 12/09/2018   Procedure: CARPAL TUNNEL RELEASE;  Surgeon: Daryll Brod, MD;  Location: [facility];  Service: Orthopedics;  Laterality: Left;   CATARACT EXTRACTION Bilateral    L 2004, R 2006   CHOLECYSTECTOMY     cyst removed left breast     TRIGGER FINGER RELEASE Right 08/19/2018   Procedure: RELEASE RIGHT INDEX TRIGGER FINGER/A-1 PULLEY;  Surgeon: Daryll Brod, MD;  Location: [facility];  Service: Orthopedics;  Laterality: Right;   WISDOM TOOTH EXTRACTION      Allergies as of 04/04/2021       Reactions   Erythromycin Other (See Comments)   REACTION:  Confusion   Sulfonamide Derivatives Other (See Comments)   Pt reported sisters x 4 have had reaction to SULFA, so pt's DR. in [place] said to avoid taking SULFA.   Tape Rash        Medication List        Accurate as of April 04, 2021 11:59 PM. If you have any questions, ask your nurse or doctor.          albuterol 108 (90 Base) MCG/ACT inhaler Commonly known as: VENTOLIN HFA Inhale into the lungs every 6 (six) hours as needed for wheezing or shortness of breath.   azelastine 0.1 % nasal spray Commonly known as: ASTELIN Place 1-2 sprays into both nostrils 2 (two) times daily as needed for rhinitis or allergies. Use in each nostril as directed   cholecalciferol 1000 units tablet Commonly known as: VITAMIN D Take 1,000 Units by mouth daily.   cyanocobalamin 100 MCG tablet Take 100 mcg by mouth daily.   multivitamin per tablet Take 1 tablet by mouth daily.   naproxen sodium 220 MG tablet Commonly known as: ALEVE Take 220 mg by mouth daily as needed.   polyethylene glycol 17 g packet Commonly known as: MIRALAX / GLYCOLAX Take 17 g by mouth daily as needed.           Objective:   Physical Exam BP 124/76 (BP Location: Left Arm, Patient Position: Sitting, Cuff Size: Normal)  Pulse 76   Temp 98.5 F (36.9 C) (Oral)   Resp 18   Ht 5\' 6"  (1.676 m)   Wt 198 lb 2 oz (89.9 kg)   SpO2 96%   BMI 31.98 kg/m  General:   Well developed, NAD, BMI noted.  HEENT:  Normocephalic . Face symmetric, atraumatic Neck: No JVD at 45 degrees Lungs:  CTA B Normal respiratory effort, no intercostal retractions, no accessory muscle use. Heart: RRR,  no murmur.  Abdomen:  Not distended, soft, non-tender. No rebound or rigidity.   Skin: Not pale. Not jaundice Lower extremities: no pretibial edema bilaterally  Neurologic:  alert & oriented X3.  Speech normal  Psych--  Cognition and judgment appear intact.  Cooperative with normal attention span and concentration.  Behavior  appropriate. No anxious or depressed appearing.     Assessment    Assessment  Asthma MSK -CTS B -DJD  -hands, saw ortho before, had XRs -spine stenosis, Dx per Dr Nelva Bush via MRI 11-2016 ( Mild spinal stenosis L4-5. Grade 1 anterior slip with severe facet degeneratio) Menopause -- onset ~ 60 y/o  PLAN Preop:  Patient needs total hip replacement , cardiopulmonary review of systems and physical exam is essentially normal. EKG today: NSR Plan: Surgery requested: CBC, CMP, INR, A1c, UA She is cleared for surgery pending labs Encouraged to think about prehabilitation Avoid NSAIDs or aspirin 1 week before surgery Snoring: Epworth scale today: 5, negative RTC 10-2021 CPX     This visit occurred during the SARS-CoV-2 public health emergency.  Safety protocols were in place, including screening questions prior to the visit, additional usage of staff PPE, and extensive cleaning of exam room while observing appropriate contact time as indicated for disinfecting solutions.

## 2021-04-04 NOTE — Patient Instructions (Addendum)
Stop naproxen, ibuprofen or aspirin 1 week before your surgery  Think about doing "pre-habilitation"  GO TO THE LAB : Get the blood work     GO TO [place], [place] Come back for your physical exam on January 2023

## 2021-04-05 LAB — CBC WITH DIFFERENTIAL/PLATELET
Basophils Absolute: 0.1 10*3/uL (ref 0.0–0.1)
Basophils Relative: 1 % (ref 0.0–3.0)
Eosinophils Absolute: 0.2 10*3/uL (ref 0.0–0.7)
Eosinophils Relative: 3.1 % (ref 0.0–5.0)
HCT: 40.4 % (ref 36.0–46.0)
Hemoglobin: 13.7 g/dL (ref 12.0–15.0)
Lymphocytes Relative: 23.4 % (ref 12.0–46.0)
Lymphs Abs: 1.7 10*3/uL (ref 0.7–4.0)
MCHC: 33.8 g/dL (ref 30.0–36.0)
MCV: 89.4 fl (ref 78.0–100.0)
Monocytes Absolute: 0.5 10*3/uL (ref 0.1–1.0)
Monocytes Relative: 6.5 % (ref 3.0–12.0)
Neutro Abs: 4.8 10*3/uL (ref 1.4–7.7)
Neutrophils Relative %: 66 % (ref 43.0–77.0)
Platelets: 216 10*3/uL (ref 150.0–400.0)
RBC: 4.51 Mil/uL (ref 3.87–5.11)
RDW: 13.6 % (ref 11.5–15.5)
WBC: 7.3 10*3/uL (ref 4.0–10.5)

## 2021-04-05 LAB — URINALYSIS, ROUTINE W REFLEX MICROSCOPIC
Bilirubin Urine: NEGATIVE
Ketones, ur: NEGATIVE
Leukocytes,Ua: NEGATIVE
Nitrite: NEGATIVE
Specific Gravity, Urine: 1.01 (ref 1.000–1.030)
Total Protein, Urine: NEGATIVE
Urine Glucose: NEGATIVE
Urobilinogen, UA: 0.2 (ref 0.0–1.0)
WBC, UA: NONE SEEN (ref 0–?)
pH: 7 (ref 5.0–8.0)

## 2021-04-05 LAB — HEMOGLOBIN A1C: Hgb A1c MFr Bld: 6 % (ref 4.6–6.5)

## 2021-04-05 LAB — COMPREHENSIVE METABOLIC PANEL
ALT: 23 U/L (ref 0–35)
AST: 19 U/L (ref 0–37)
Albumin: 4.2 g/dL (ref 3.5–5.2)
Alkaline Phosphatase: 73 U/L (ref 39–117)
BUN: 14 mg/dL (ref 6–23)
CO2: 32 mEq/L (ref 19–32)
Calcium: 9.3 mg/dL (ref 8.4–10.5)
Chloride: 108 mEq/L (ref 96–112)
Creatinine, Ser: 0.63 mg/dL (ref 0.40–1.20)
GFR: 95.12 mL/min (ref >60.00)
Glucose, Bld: 101 mg/dL — ABNORMAL HIGH (ref 70–99)
Potassium: 4.3 mEq/L (ref 3.5–5.1)
Sodium: 149 mEq/L — ABNORMAL HIGH (ref 135–145)
Total Bilirubin: 0.3 mg/dL (ref 0.2–1.2)
Total Protein: 6.6 g/dL (ref 6.0–8.3)

## 2021-04-05 LAB — PROTIME-INR
INR: 0.9 ratio (ref 0.8–1.0)
Prothrombin Time: 10.2 s (ref 9.6–13.1)

## 2021-04-05 NOTE — Telephone Encounter (Signed)
Received fax confirmation

## 2021-04-05 NOTE — Telephone Encounter (Addendum)
Pt cleared for surgery per PCP. Form completed and faxed to ATTN: Glendale Chard at [facility] [phone] w/ ov notes, ekg and lab. Form sent for scanning.

## 2021-04-06 NOTE — Assessment & Plan Note (Signed)
Preop:  Patient needs total hip replacement , cardiopulmonary review of systems and physical exam is essentially normal. EKG today: NSR Plan: Surgery requested: CBC, CMP, INR, A1c, UA She is cleared for surgery pending labs Encouraged to think about prehabilitation Avoid NSAIDs or aspirin 1 week before surgery Snoring: Epworth scale today: 5, negative RTC 10-2021 CPX

## 2021-05-01 HISTORY — PX: TOTAL HIP ARTHROPLASTY: SHX124

## 2021-05-16 DIAGNOSIS — M1612 Unilateral primary osteoarthritis, left hip: Secondary | ICD-10-CM | POA: Diagnosis not present

## 2021-06-23 ENCOUNTER — Encounter: Payer: Self-pay | Admitting: Internal Medicine

## 2021-07-04 DIAGNOSIS — Z961 Presence of intraocular lens: Secondary | ICD-10-CM | POA: Diagnosis not present

## 2021-07-20 ENCOUNTER — Encounter: Payer: Self-pay | Admitting: Internal Medicine

## 2021-08-01 ENCOUNTER — Telehealth: Payer: Self-pay | Admitting: Internal Medicine

## 2021-08-01 ENCOUNTER — Encounter: Payer: Self-pay | Admitting: Internal Medicine

## 2021-08-01 DIAGNOSIS — Z01818 Encounter for other preprocedural examination: Secondary | ICD-10-CM

## 2021-08-01 HISTORY — PX: TOTAL HIP ARTHROPLASTY: SHX124

## 2021-08-01 NOTE — Telephone Encounter (Signed)
Pt stated they needed labs done for a surgery and they will be faxing over the requests.

## 2021-08-01 NOTE — Telephone Encounter (Signed)
This is the pt with the form that I gave you.

## 2021-08-02 NOTE — Telephone Encounter (Signed)
Patient needs CBC , CMP and INR .Marland Kitchen. is it okay to drop orders and schedule lab appointment

## 2021-08-03 NOTE — Telephone Encounter (Addendum)
Okay to schedule a CMP, CBC and INR under my name.  DX preop I will forward results to [facility] (sean Childers PA-C) Also, needs flu shot when she comes to the office.

## 2021-08-03 NOTE — Telephone Encounter (Signed)
Patient called back about lab orders, she was informed that lab orders are not in yet but she will be contacted to make lab appointment if they are put in. Please advice.

## 2021-08-04 NOTE — Addendum Note (Signed)
Addended by: Jeronimo Greaves on: 08/04/2021 08:39 AM   Modules accepted: Orders

## 2021-08-04 NOTE — Telephone Encounter (Signed)
Labs dropped and appointment made   Will place paperwork on [facility] desk

## 2021-08-08 ENCOUNTER — Other Ambulatory Visit (INDEPENDENT_AMBULATORY_CARE_PROVIDER_SITE_OTHER): Payer: BC Managed Care – PPO

## 2021-08-08 ENCOUNTER — Other Ambulatory Visit: Payer: Self-pay

## 2021-08-08 ENCOUNTER — Encounter: Payer: Self-pay | Admitting: Internal Medicine

## 2021-08-08 ENCOUNTER — Ambulatory Visit (INDEPENDENT_AMBULATORY_CARE_PROVIDER_SITE_OTHER): Payer: BC Managed Care – PPO

## 2021-08-08 DIAGNOSIS — Z23 Encounter for immunization: Secondary | ICD-10-CM | POA: Diagnosis not present

## 2021-08-08 DIAGNOSIS — Z01818 Encounter for other preprocedural examination: Secondary | ICD-10-CM

## 2021-08-08 LAB — CBC WITH DIFFERENTIAL/PLATELET
Basophils Absolute: 0.1 10*3/uL (ref 0.0–0.1)
Basophils Relative: 0.8 % (ref 0.0–3.0)
Eosinophils Absolute: 0.2 10*3/uL (ref 0.0–0.7)
Eosinophils Relative: 3 % (ref 0.0–5.0)
HCT: 41 % (ref 36.0–46.0)
Hemoglobin: 13.3 g/dL (ref 12.0–15.0)
Lymphocytes Relative: 25 % (ref 12.0–46.0)
Lymphs Abs: 1.9 10*3/uL (ref 0.7–4.0)
MCHC: 32.5 g/dL (ref 30.0–36.0)
MCV: 88.1 fl (ref 78.0–100.0)
Monocytes Absolute: 0.5 10*3/uL (ref 0.1–1.0)
Monocytes Relative: 6.7 % (ref 3.0–12.0)
Neutro Abs: 5 10*3/uL (ref 1.4–7.7)
Neutrophils Relative %: 64.5 % (ref 43.0–77.0)
Platelets: 257 10*3/uL (ref 150.0–400.0)
RBC: 4.66 Mil/uL (ref 3.87–5.11)
RDW: 13.6 % (ref 11.5–15.5)
WBC: 7.7 10*3/uL (ref 4.0–10.5)

## 2021-08-08 LAB — COMPREHENSIVE METABOLIC PANEL
ALT: 16 U/L (ref 0–35)
AST: 15 U/L (ref 0–37)
Albumin: 4.1 g/dL (ref 3.5–5.2)
Alkaline Phosphatase: 88 U/L (ref 39–117)
BUN: 10 mg/dL (ref 6–23)
CO2: 34 mEq/L — ABNORMAL HIGH (ref 19–32)
Calcium: 9.5 mg/dL (ref 8.4–10.5)
Chloride: 106 mEq/L (ref 96–112)
Creatinine, Ser: 0.63 mg/dL (ref 0.40–1.20)
GFR: 94.89 mL/min (ref >60.00)
Glucose, Bld: 112 mg/dL — ABNORMAL HIGH (ref 70–99)
Potassium: 4.4 mEq/L (ref 3.5–5.1)
Sodium: 147 mEq/L — ABNORMAL HIGH (ref 135–145)
Total Bilirubin: 0.3 mg/dL (ref 0.2–1.2)
Total Protein: 6.5 g/dL (ref 6.0–8.3)

## 2021-08-08 NOTE — Progress Notes (Signed)
Per the orders of Dr. Larose Kells pt is here for labs, pt tolerated draw well.

## 2021-08-09 LAB — PROTIME-INR
INR: 0.9 ratio (ref 0.8–1.0)
Prothrombin Time: 10 s (ref 9.6–13.1)

## 2021-08-10 NOTE — Telephone Encounter (Signed)
Lab results faxed to Fenton Foy, PA-C at [facility].

## 2021-08-15 DIAGNOSIS — M1611 Unilateral primary osteoarthritis, right hip: Secondary | ICD-10-CM | POA: Diagnosis not present

## 2021-10-04 ENCOUNTER — Encounter: Payer: Self-pay | Admitting: Internal Medicine

## 2021-10-06 ENCOUNTER — Encounter: Payer: Self-pay | Admitting: Internal Medicine

## 2021-10-06 ENCOUNTER — Ambulatory Visit (INDEPENDENT_AMBULATORY_CARE_PROVIDER_SITE_OTHER): Payer: BC Managed Care – PPO | Admitting: Internal Medicine

## 2021-10-06 VITALS — BP 126/72 | HR 74 | Temp 97.8°F | Resp 18 | Ht 66.0 in | Wt 199.5 lb

## 2021-10-06 DIAGNOSIS — Z0000 Encounter for general adult medical examination without abnormal findings: Secondary | ICD-10-CM

## 2021-10-06 DIAGNOSIS — R739 Hyperglycemia, unspecified: Secondary | ICD-10-CM

## 2021-10-06 LAB — COMPREHENSIVE METABOLIC PANEL
ALT: 16 U/L (ref 0–35)
AST: 15 U/L (ref 0–37)
Albumin: 4.1 g/dL (ref 3.5–5.2)
Alkaline Phosphatase: 92 U/L (ref 39–117)
BUN: 18 mg/dL (ref 6–23)
CO2: 32 mEq/L (ref 19–32)
Calcium: 9.5 mg/dL (ref 8.4–10.5)
Chloride: 108 mEq/L (ref 96–112)
Creatinine, Ser: 0.63 mg/dL (ref 0.40–1.20)
GFR: 94.78 mL/min (ref >60.00)
Glucose, Bld: 93 mg/dL (ref 70–99)
Potassium: 4.6 mEq/L (ref 3.5–5.1)
Sodium: 147 mEq/L — ABNORMAL HIGH (ref 135–145)
Total Bilirubin: 0.4 mg/dL (ref 0.2–1.2)
Total Protein: 6.5 g/dL (ref 6.0–8.3)

## 2021-10-06 LAB — LIPID PANEL
Cholesterol: 223 mg/dL — ABNORMAL HIGH (ref 0–200)
HDL: 49.3 mg/dL (ref >39.00)
NonHDL: 173.92
Total CHOL/HDL Ratio: 5
Triglycerides: 205 mg/dL — ABNORMAL HIGH (ref 0.0–149.0)
VLDL: 41 mg/dL — ABNORMAL HIGH (ref 0.0–40.0)

## 2021-10-06 LAB — CBC WITH DIFFERENTIAL/PLATELET
Basophils Absolute: 0.1 10*3/uL (ref 0.0–0.1)
Basophils Relative: 0.7 % (ref 0.0–3.0)
Eosinophils Absolute: 0.3 10*3/uL (ref 0.0–0.7)
Eosinophils Relative: 4.2 % (ref 0.0–5.0)
HCT: 41.3 % (ref 36.0–46.0)
Hemoglobin: 13.2 g/dL (ref 12.0–15.0)
Lymphocytes Relative: 23.3 % (ref 12.0–46.0)
Lymphs Abs: 1.9 10*3/uL (ref 0.7–4.0)
MCHC: 31.9 g/dL (ref 30.0–36.0)
MCV: 88.4 fl (ref 78.0–100.0)
Monocytes Absolute: 0.6 10*3/uL (ref 0.1–1.0)
Monocytes Relative: 7.3 % (ref 3.0–12.0)
Neutro Abs: 5.1 10*3/uL (ref 1.4–7.7)
Neutrophils Relative %: 64.5 % (ref 43.0–77.0)
Platelets: 265 10*3/uL (ref 150.0–400.0)
RBC: 4.67 Mil/uL (ref 3.87–5.11)
RDW: 14.1 % (ref 11.5–15.5)
WBC: 8 10*3/uL (ref 4.0–10.5)

## 2021-10-06 LAB — HEMOGLOBIN A1C: Hgb A1c MFr Bld: 6 % (ref 4.6–6.5)

## 2021-10-06 LAB — LDL CHOLESTEROL, DIRECT: Direct LDL: 152 mg/dL

## 2021-10-06 LAB — TSH: TSH: 2.17 u[IU]/mL (ref 0.35–5.50)

## 2021-10-06 MED ORDER — BUDESONIDE-FORMOTEROL FUMARATE 160-4.5 MCG/ACT IN AERO: 2.0000 | INHALATION_SPRAY | Freq: Two times a day (BID) | RESPIRATORY_TRACT | 5 refills | Status: AC | PRN

## 2021-10-06 MED ORDER — HYDROCORTISONE 2.5 % EX CREA: TOPICAL_CREAM | Freq: Two times a day (BID) | CUTANEOUS | 0 refills | Status: AC

## 2021-10-06 NOTE — Progress Notes (Signed)
Subjective:    Patient ID: Paula Leblanc, female    DOB: [birth year], 61 y.o.   MRN: [id number]  DOS:  10/06/2021 Type of visit - description: CPX  Since the last office visit is doing well, had both hips replaced. No concerns except the area of mild redness and  TTP at the left leg.   Review of Systems  Other than above, a 14 point review of systems is negative     Past Medical History:  Diagnosis Date   Allergy    Carpal tunnel syndrome, bilateral    Cataract    bil cataracts removed   Hx of adenomatous polyp of colon 09/26/2016   Intrinsic asthma 04/15/2015   Osteoarthritis of both hands    PONV (postoperative nausea and vomiting)    Seasonal allergies     Past Surgical History:  Procedure Laterality Date   BREAST EXCISIONAL BIOPSY Left    CARPAL TUNNEL RELEASE Right 08/19/2018   Procedure: RIGHT CARPAL TUNNEL RELEASE;  Surgeon: Daryll Brod, MD;  Location: [place];  Service: Orthopedics;  Laterality: Right;   CARPAL TUNNEL RELEASE Left 12/09/2018   Procedure: CARPAL TUNNEL RELEASE;  Surgeon: Daryll Brod, MD;  Location: [place];  Service: Orthopedics;  Laterality: Left;   CATARACT EXTRACTION Bilateral    L 2004, R 2006   CHOLECYSTECTOMY     cyst removed left breast     TOTAL HIP ARTHROPLASTY Left 05/2021   TOTAL HIP ARTHROPLASTY Right 08/2021   TRIGGER FINGER RELEASE Right 08/19/2018   Procedure: RELEASE RIGHT INDEX TRIGGER FINGER/A-1 PULLEY;  Surgeon: Daryll Brod, MD;  Location: [place];  Service: Orthopedics;  Laterality: Right;   WISDOM TOOTH EXTRACTION     Social History   Socioeconomic History   Marital status: Married    Spouse name: Not on file   Number of children: 1   Years of education: Not on file   Highest education level: Not on file  Occupational History   Occupation: Glass blower/designer, law firm  Tobacco Use   Smoking status: Never   Smokeless tobacco: Never  Vaping Use   Vaping Use: Never  used  Substance and Sexual Activity   Alcohol use: No   Drug use: No   Sexual activity: Not on file  Other Topics Concern   Not on file  Social History Narrative   Married w/ Almond Lint, daughter in [employer] of [employer]    Daughter used to live in [place], works now in [place] Strain: Not on [employer] Insecurity: Not on file  Transportation Needs: Not on file  Physical Activity: Not on file  Stress: Not on file  Social Connections: Not on file  Intimate Partner Violence: Not on file    Allergies as of 10/06/2021       Reactions   Erythromycin Other (See Comments)   REACTION: Confusion   Sulfonamide Derivatives Other (See Comments)   Pt reported sisters x 4 have had reaction to SULFA, so pt's DR. in [place] said to avoid taking SULFA.   Tape Rash        Medication List        Accurate as of October 06, 2021 11:59 PM. If you have any questions, ask your nurse or doctor.          STOP taking these medications    albuterol 108 (90 Base) MCG/ACT inhaler Commonly  known as: VENTOLIN HFA Stopped by: Kathlene November, MD   naproxen sodium 220 MG tablet Commonly known as: ALEVE Stopped by: Kathlene November, MD       TAKE these medications    azelastine 0.1 % nasal spray Commonly known as: ASTELIN Place 1-2 sprays into both nostrils 2 (two) times daily as needed for rhinitis or allergies. Use in each nostril as directed   budesonide-formoterol 160-4.5 MCG/ACT inhaler Commonly known as: SYMBICORT Inhale 2 puffs into the lungs 2 (two) times daily as needed. Started by: Kathlene November, MD   cholecalciferol 1000 units tablet Commonly known as: VITAMIN D Take 1,000 Units by mouth daily.   cyanocobalamin 100 MCG tablet Take 100 mcg by mouth daily.   hydrocortisone 2.5 % cream Apply topically 2 (two) times daily. Started by: Kathlene November, MD   multivitamin per tablet Take 1 tablet by mouth daily.   polyethylene glycol 17 g  packet Commonly known as: MIRALAX / GLYCOLAX Take 17 g by mouth daily as needed.           Objective:   Physical Exam Skin:         Comments: Left leg, has a area of mild dryness or erythema without warmness.  On palpation there is 2 x 1 cm mild very soft induration.  No warmness or redness.  Calves are symmetric and actually not tender   BP 126/72 (BP Location: Left Arm, Patient Position: Sitting, Cuff Size: Small)    Pulse 74    Temp 97.8 F (36.6 C) (Oral)    Resp 18    Ht 5\' 6"  (1.676 m)    Wt 199 lb 8 oz (90.5 kg)    SpO2 97%    BMI 32.20 kg/m  General: Well developed, NAD, BMI noted Neck: No  thyromegaly  HEENT:  Normocephalic . Face symmetric, atraumatic Lungs:  CTA B Normal respiratory effort, no intercostal retractions, no accessory muscle use. Heart: RRR,  no murmur.  Abdomen:  Not distended, soft, non-tender. No rebound or rigidity.   Lower extremities: Calves are symmetric, no TTP.  See graphic Skin: Exposed areas without rash. Not pale. Not jaundice Neurologic:  alert & oriented X3.  Speech normal, gait appropriate for age and unassisted Strength symmetric and appropriate for age.  Psych: Cognition and judgment appear intact.  Cooperative with normal attention span and concentration.  Behavior appropriate. No anxious or depressed appearing.     Assessment      Assessment  Hyperglycemia Asthma MSK -CTS B -DJD  -hands, saw ortho before, had XRs -spine stenosis, Dx per Dr Nelva Bush via MRI 11-2016 ( Mild spinal stenosis L4-5. Grade 1 anterior slip with severe facet degeneratio) Menopause -- onset ~ 61 y/o FH CAD F age 34  PLAN Here for CPX Asthma: Well-controlled, uses Symbicort as needed.  That is appropriate according to the new guidelines.  RF sent DJD: Had 2 hip replacements in the last few months, in the outpatient setting, feeling great. Varicose vein?  Eczema?  Has an area of mild induration but also very dry skin at the left leg.  See physical  exam.  Patient will call if the area increase in size or gets more tender.  Hydrocortisone 2.5% as needed. RTC 1 year CPX.  Sooner if needed    This visit occurred during the SARS-CoV-2 public health emergency.  Safety protocols were in place, including screening questions prior to the visit, additional usage of staff PPE, and extensive cleaning of exam room while observing  appropriate contact time as indicated for disinfecting solutions.

## 2021-10-06 NOTE — Patient Instructions (Addendum)
Apply the cream to the left leg twice daily as needed If the area is getting worse let me know   Proceed with a COVID-vaccine booster and Shingrix vaccine at your convenience   GO TO THE LAB : Get the blood work     [place], [place] back for a CPX in 1 year.  Sooner if needed.    "Living will", "Firth of attorney": Advanced care planning  (If you already have a living will or healthcare power of attorney, please bring the copy to be scanned in your chart.)  Advance care planning is a process that supports adults in  understanding and sharing their preferences regarding future medical care.   The patient's preferences are recorded in documents called Advance Directives.    Advanced directives are completed (and can be modified at any time) while the patient is in full mental capacity.   The documentation should be available at all times to the patient, the family and the healthcare providers.  Bring in a copy to be scanned in your chart is an excellent idea and is recommended   This legal documents direct treatment decision making and/or appoint a surrogate to make the decision if the patient is not capable to do so.    Advance directives can be documented in many types of formats,  documents have names such as:  Lliving will  Durable power of attorney for healthcare (healthcare proxy or healthcare power of attorney)  Combined directives  Physician orders for life-sustaining treatment    More information at:  [email or web address]

## 2021-10-07 ENCOUNTER — Encounter: Payer: Self-pay | Admitting: Internal Medicine

## 2021-10-07 NOTE — Assessment & Plan Note (Signed)
-  Td 07-2016 - pnm shot 2017, prevnar:07-2015 - shingrix: recommended  -covid vax: booster rec  -had a  flu shot   -Female care : saw gyn 07-2020  MMG: 08/2020 ([facility]), plans to schedule another MMG DEXA: plans to do @ gyn at next OV. On vitamin D.   -CCS:  cscope 11-2010 , tics; cscope 08-2016, 1 polyp, next due , pt plans to call GI -Labs:  CMP, FLP, CBC, A1c TSH -Diet exercise: Counseled -ACP information provided

## 2021-10-07 NOTE — Assessment & Plan Note (Signed)
Here for CPX Asthma: Well-controlled, uses Symbicort as needed.  That is appropriate according to the new guidelines.  RF sent DJD: Had 2 hip replacements in the last few months, in the outpatient setting, feeling great. Varicose vein?  Eczema?  Has an area of mild induration but also very dry skin at the left leg.  See physical exam.  Patient will call if the area increase in size or gets more tender.  Hydrocortisone 2.5% as needed. RTC 1 year CPX.  Sooner if needed

## 2021-12-05 ENCOUNTER — Ambulatory Visit: Payer: Self-pay | Attending: Internal Medicine

## 2021-12-05 DIAGNOSIS — Z23 Encounter for immunization: Secondary | ICD-10-CM

## 2021-12-05 NOTE — Progress Notes (Signed)
? ?  Covid-19 Vaccination Clinic ? ?Name:  KESTREL MIS    ?MRN: [id number] ?DOB: 03/16/1961 ? ?12/05/2021 ? ?Ms. Gossett was observed post Covid-19 immunization for 15 minutes without incident. She was provided with Vaccine Information Sheet and instruction to access the V-Safe system.  ? ?Ms. Face was instructed to call 911 with any severe reactions post vaccine: ?Difficulty breathing  ?Swelling of face and throat  ?A fast heartbeat  ?A bad rash all over body  ?Dizziness and weakness  ? ?Immunizations Administered   ? ? Name Date Dose VIS Date Route  ? Moderna Covid-19 vaccine Bivalent Booster 12/05/2021  3:18 PM 0.5 mL 05/13/2021 Intramuscular  ? Manufacturer: Moderna  ? Lot: [id number]  ? [place]: [phone]  ? ?  ? ? ?

## 2021-12-07 ENCOUNTER — Other Ambulatory Visit (HOSPITAL_BASED_OUTPATIENT_CLINIC_OR_DEPARTMENT_OTHER): Payer: Self-pay

## 2021-12-07 MED ORDER — MODERNA COVID-19 BIVAL BOOSTER 50 MCG/0.5ML IM SUSP
INTRAMUSCULAR | 0 refills | Status: DC
  Filled 2021-12-07: qty 0.5, 1d supply, fill #0

## 2022-05-29 ENCOUNTER — Other Ambulatory Visit (HOSPITAL_BASED_OUTPATIENT_CLINIC_OR_DEPARTMENT_OTHER): Payer: Self-pay

## 2022-05-29 ENCOUNTER — Ambulatory Visit (INDEPENDENT_AMBULATORY_CARE_PROVIDER_SITE_OTHER): Payer: 59 | Admitting: Medical

## 2022-05-29 VITALS — BP 108/60 | HR 68 | Temp 97.9°F | Resp 18 | Ht 66.0 in | Wt 207.4 lb

## 2022-05-29 DIAGNOSIS — H6690 Otitis media, unspecified, unspecified ear: Secondary | ICD-10-CM

## 2022-05-29 DIAGNOSIS — R062 Wheezing: Secondary | ICD-10-CM | POA: Diagnosis not present

## 2022-05-29 DIAGNOSIS — J0110 Acute frontal sinusitis, unspecified: Secondary | ICD-10-CM | POA: Diagnosis not present

## 2022-05-29 DIAGNOSIS — J45909 Unspecified asthma, uncomplicated: Secondary | ICD-10-CM

## 2022-05-29 DIAGNOSIS — J40 Bronchitis, not specified as acute or chronic: Secondary | ICD-10-CM

## 2022-05-29 MED ORDER — BENZONATATE 100 MG PO CAPS
100.0000 mg | ORAL_CAPSULE | Freq: Three times a day (TID) | ORAL | 0 refills | Status: DC | PRN
  Filled 2022-05-29: qty 30, 10d supply, fill #0

## 2022-05-29 MED ORDER — AMOXICILLIN-POT CLAVULANATE 875-125 MG PO TABS
1.0000 | ORAL_TABLET | Freq: Two times a day (BID) | ORAL | 0 refills | Status: DC
  Filled 2022-05-29: qty 20, 10d supply, fill #0

## 2022-05-29 MED ORDER — AMOXICILLIN-POT CLAVULANATE 875-125 MG PO TABS: 1.0000 | ORAL_TABLET | Freq: Two times a day (BID) | ORAL | 0 refills | Status: DC

## 2022-05-29 NOTE — Patient Instructions (Addendum)
Sinus infection with appearance of bilateral ear infections. Rx augmentin antibiotic and benzonatate for cough.  For nasal congestion recommend using  kirkland nasal spray kirkland equivalent to flonase.  Some bronchitis with wheezing(mild early asthma flare). Recommend using your symbicort daily. If wheezing or cough worsening despite the above can get chest xray. Future order placed today.  Follow up as regularly scheduled with pcp but sooner if needed

## 2022-05-29 NOTE — Progress Notes (Signed)
Subjective:    Patient ID: Paula Leblanc, female    DOB: 07/10/1961, 61 y.o.   MRN: [id number]  HPI  Pt in for 2 weeks cough, congestion(nasal & chest), runny nose, sinus pressure and rt ear pressure.  Some chills occasional. T max 99.   Some wheezing at times. Pt has symbicort available to use if needed. Hx of asthma and some wheezing occurring at night over past 3 nights.    Review of Systems  Constitutional:  Negative for chills, fatigue and fever.  HENT:  Positive for congestion and ear pain.   Respiratory:  Positive for cough and wheezing. Negative for chest tightness and shortness of breath.   Cardiovascular:  Negative for chest pain and palpitations.  Gastrointestinal:  Negative for abdominal pain.  Genitourinary:  Negative for dysuria.  Musculoskeletal:  Negative for back pain.  Neurological:  Negative for dizziness and headaches.  Hematological:  Negative for adenopathy. Does not bruise/bleed easily.  Psychiatric/Behavioral:  Negative for behavioral problems and confusion.     Past Medical History:  Diagnosis Date   Allergy    Carpal tunnel syndrome, bilateral    Cataract    bil cataracts removed   Hx of adenomatous polyp of colon 09/26/2016   Intrinsic asthma 04/15/2015   Osteoarthritis of both hands    PONV (postoperative nausea and vomiting)    Seasonal allergies      Social History   Socioeconomic History   Marital status: Married    Spouse name: Not on file   Number of children: 1   Years of education: Not on file   Highest education level: Not on file  Occupational History   Occupation: Glass blower/designer, law firm  Tobacco Use   Smoking status: Never   Smokeless tobacco: Never  Vaping Use   Vaping Use: Never used  Substance and Sexual Activity   Alcohol use: No   Drug use: No   Sexual activity: Not on file  Other Topics Concern   Not on file  Social History Narrative   Married w/ Almond Lint, daughter in [employer] of [employer]    Daughter used to  live in [place], works now in [place] Strain: Not on [employer] Insecurity: Not on file  Transportation Needs: Not on file  Physical Activity: Not on file  Stress: Not on file  Social Connections: Not on file  Intimate Partner Violence: Not on file    Past Surgical History:  Procedure Laterality Date   BREAST EXCISIONAL BIOPSY Left    CARPAL TUNNEL RELEASE Right 08/19/2018   Procedure: RIGHT CARPAL [place];  Surgeon: Daryll Brod, MD;  Location: [place];  Service: Orthopedics;  Laterality: Right;   CARPAL TUNNEL RELEASE Left 12/09/2018   Procedure: CARPAL TUNNEL RELEASE;  Surgeon: Daryll Brod, MD;  Location: [place];  Service: Orthopedics;  Laterality: Left;   CATARACT EXTRACTION Bilateral    L 2004, R 2006   CHOLECYSTECTOMY     cyst removed left breast     TOTAL HIP ARTHROPLASTY Left 05/2021   TOTAL HIP ARTHROPLASTY Right 08/2021   TRIGGER FINGER RELEASE Right 08/19/2018   Procedure: RELEASE RIGHT INDEX TRIGGER FINGER/A-1 PULLEY;  Surgeon: Daryll Brod, MD;  Location: [place];  Service: Orthopedics;  Laterality: Right;   WISDOM TOOTH EXTRACTION      Family History  Problem Relation Age of Onset   Colon cancer  Father 81       x2 38's 42   Diabetes Father    Heart attack Father 63   Stroke Father 20   Hyperlipidemia Mother    Osteoarthritis Mother    Melanoma Mother    Breast cancer Neg Hx    Esophageal cancer Neg Hx    Pancreatic cancer Neg Hx    Rectal cancer Neg Hx    Stomach cancer Neg Hx     Allergies  Allergen Reactions   Erythromycin Other (See Comments)    REACTION: Confusion   Sulfonamide Derivatives Other (See Comments)    Pt reported sisters x 4 have had reaction to SULFA, so pt's DR. in [place] said to avoid taking SULFA.   Tape Rash    Current Outpatient Medications on File Prior to Visit  Medication Sig Dispense Refill   azelastine  (ASTELIN) 0.1 % nasal spray Place 1-2 sprays into both nostrils 2 (two) times daily as needed for rhinitis or allergies. Use in each nostril as directed     budesonide-formoterol (SYMBICORT) 160-4.5 MCG/ACT inhaler Inhale 2 puffs into the lungs 2 (two) times daily as needed. 10.2 g 5   cholecalciferol (VITAMIN D) 1000 units tablet Take 1,000 Units by mouth daily.     COVID-19 mRNA bivalent vaccine, Moderna, (MODERNA COVID-19 BIVAL BOOSTER) 50 MCG/0.5ML injection Inject into the muscle. 0.5 mL 0   cyanocobalamin 100 MCG tablet Take 100 mcg by mouth daily.     hydrocortisone 2.5 % cream Apply topically 2 (two) times daily. 30 g 0   multivitamin (THERAGRAN) per tablet Take 1 tablet by mouth daily.     polyethylene glycol (MIRALAX / GLYCOLAX) packet Take 17 g by mouth daily as needed.     No current facility-administered medications on file prior to visit.    BP 108/60   Pulse 68   Temp 97.9 F (36.6 C)   Resp 18   Ht '5\' 6"'$  (1.676 m)   Wt 207 lb 6.4 oz (94.1 kg)   SpO2 95%   BMI 33.48 kg/m        Objective:   Physical Exam  General Mental Status- Alert. General Appearance- Not in acute distress.   Skin General: Color- Normal Color. Moisture- Normal Moisture.  Neck Carotid Arteries- Normal color. Moisture- Normal Moisture. No carotid bruits. No JVD.  Chest and Lung Exam Auscultation: Breath Sounds:-Normal.  Cardiovascular Auscultation:Rythm- Regular. Murmurs & Other Heart Sounds:Auscultation of the heart reveals- No Murmurs.  Abdomen Inspection:-Inspeection Normal. Palpation/Percussion:Note:No mass. Palpation and Percussion of the abdomen reveal- Non Tender, Non Distended + BS, no rebound or guarding.   Neurologic Cranial Nerve exam:- CN III-XII intact(No nystagmus), symmetric smile. Strength:- 5/5 equal and symmetric strength both upper and lower extremities.   Heent- frontal sinus pressure and maxillary sinus pressure. Both tm mild bright red. Rt side worse than  left. Posterior pharynx normal.      Assessment & Plan:   Patient Instructions  Sinus infection with appearance of bilateral ear infections. Rx augmentin antibiotic and benzonatate for cough.  For nasal congestion recommend using  kirkland nasal spray kirkland equivalent to flonase.  Some bronchitis with wheezing. Recommend using your symbicort daily. If wheezing or cough worsening despite the above can get chest xray. Future order placed today.  Follow up as regularly scheduled with pcp but sooner if needed      [employer], PA-C

## 2022-06-05 ENCOUNTER — Ambulatory Visit (HOSPITAL_BASED_OUTPATIENT_CLINIC_OR_DEPARTMENT_OTHER)
Admission: RE | Admit: 2022-06-05 | Discharge: 2022-06-05 | Disposition: A | Discharge status: Home / Self Care | Payer: 59 | Source: Ambulatory Visit | Attending: Medical | Admitting: Medical

## 2022-06-05 DIAGNOSIS — J40 Bronchitis, not specified as acute or chronic: Secondary | ICD-10-CM | POA: Insufficient documentation

## 2022-06-05 DIAGNOSIS — R062 Wheezing: Secondary | ICD-10-CM | POA: Insufficient documentation

## 2022-06-12 ENCOUNTER — Ambulatory Visit (INDEPENDENT_AMBULATORY_CARE_PROVIDER_SITE_OTHER): Payer: 59

## 2022-06-12 DIAGNOSIS — Z23 Encounter for immunization: Secondary | ICD-10-CM

## 2022-07-17 IMAGING — US US BREAST*L* LIMITED INC AXILLA
1 series · 10 of 10 positions shown · non-contrast
Comparison: Previous exam(s).

CLINICAL DATA: 59-year-old female presenting for annual bilateral
mammogram and final 2 year follow-up of a probably benign left
breast mass.

EXAM:
DIGITAL DIAGNOSTIC BILATERAL MAMMOGRAM WITH CAD AND TOMO
ULTRASOUND LEFT BREAST

[Series 1: us breast*left* limited inc axilla · 0.06mm/px · 10 of 10 slices shown]
[im 1/10]
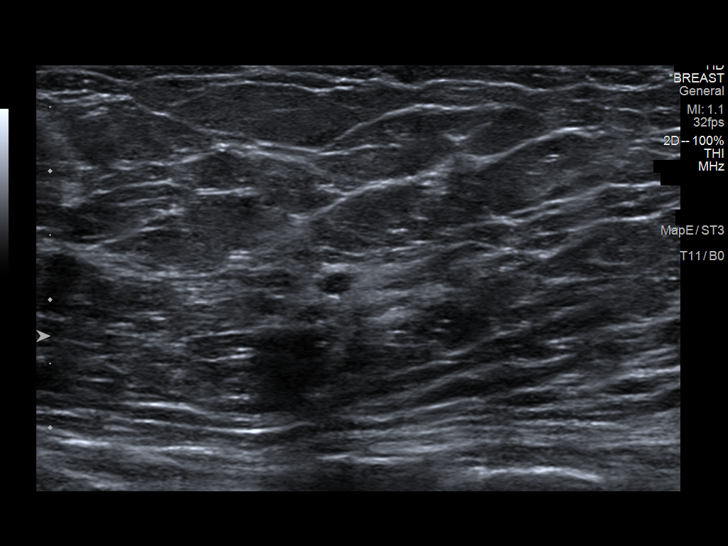
[im 2/10]
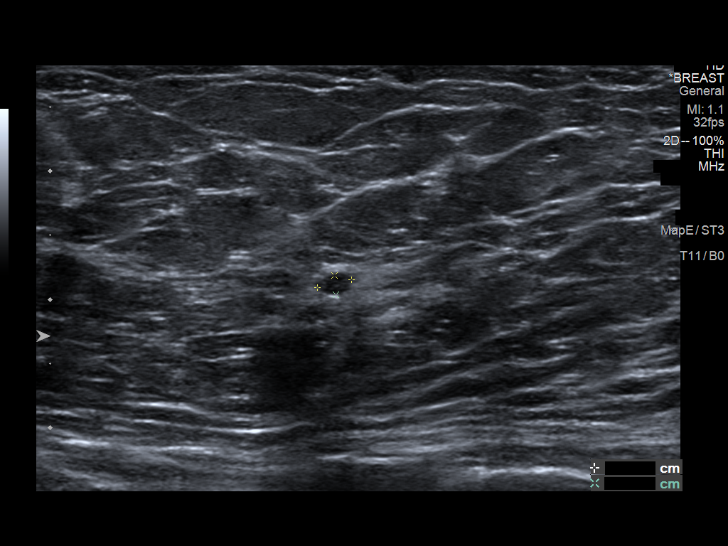
[im 3/10]
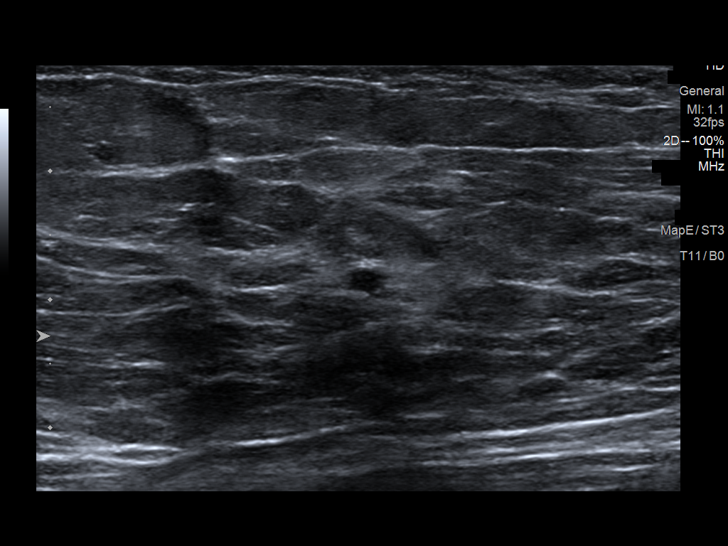
[im 4/10]
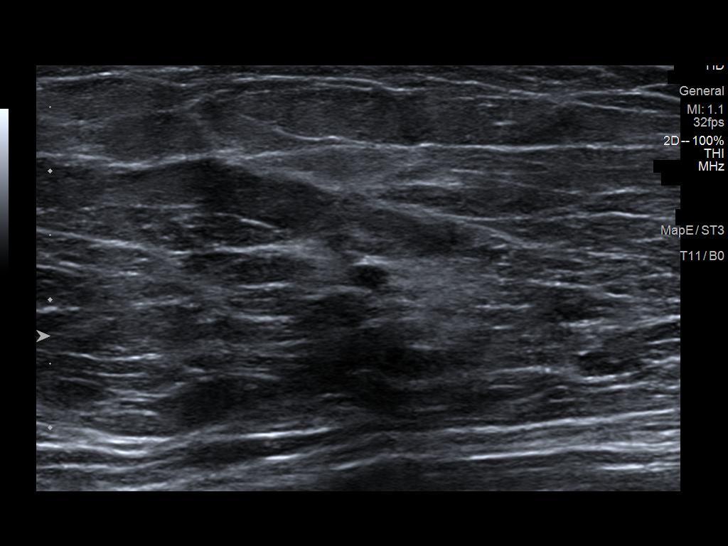
[im 5/10]
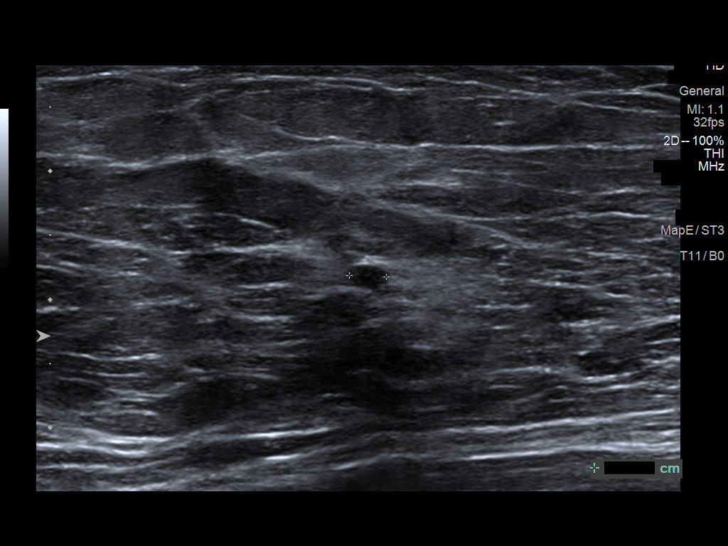
[im 6/10]
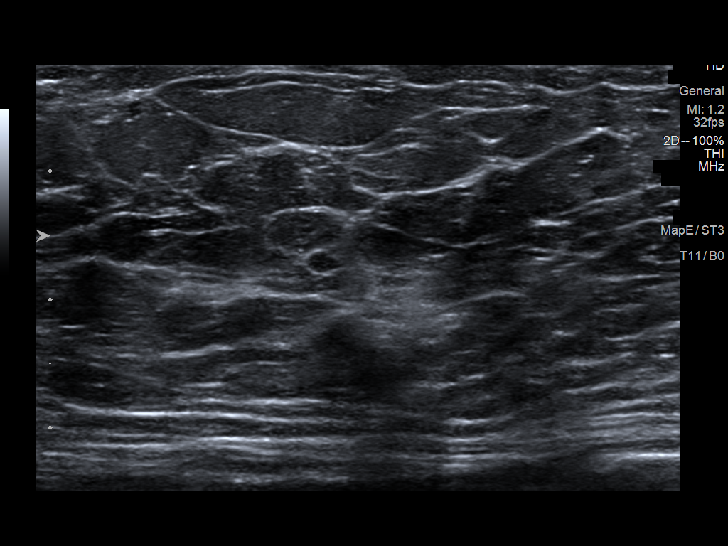
[im 7/10]
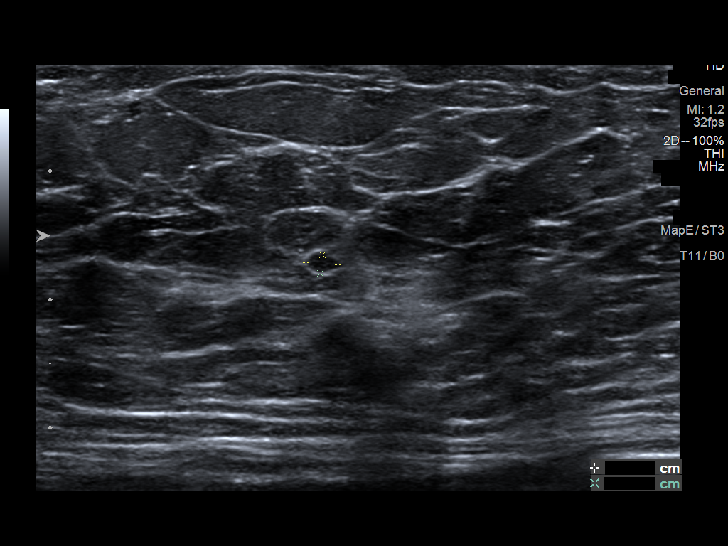
[im 8/10]
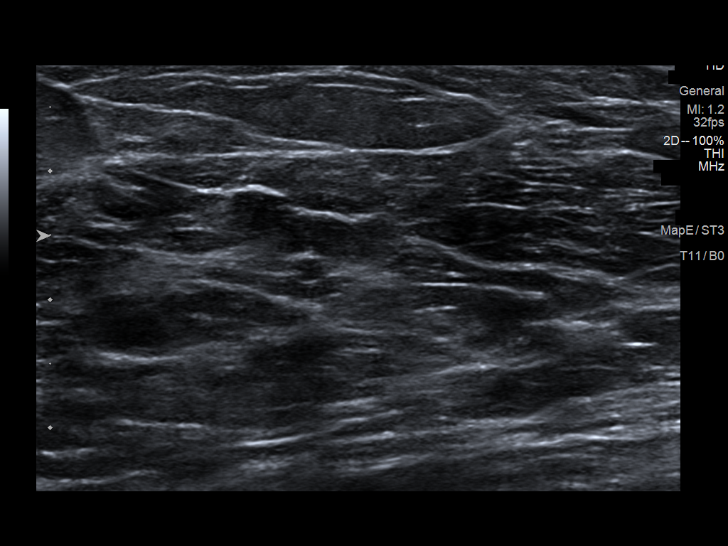
[im 9/10]
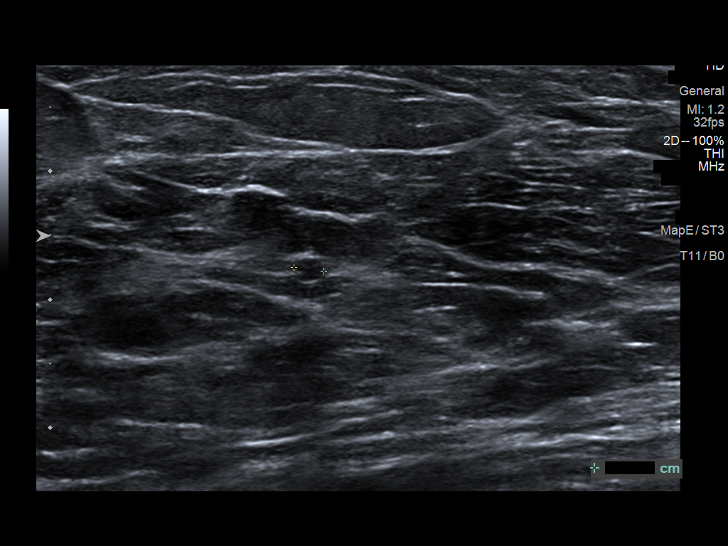
[im 10/10]
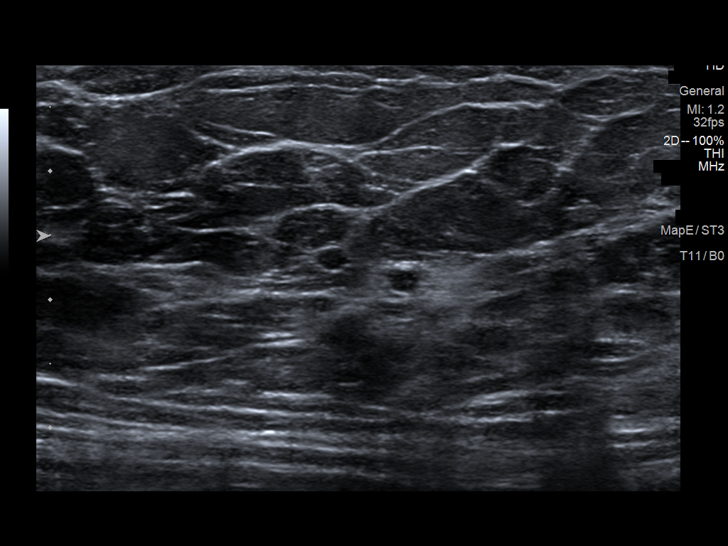

[10 of 10 positions shown; findings below may reference images not displayed]

ACR Breast Density Category b: There are scattered areas of
fibroglandular density.
FINDINGS: A small circumscribed mass in the upper outer left breast is
mammographically stable. No new or suspicious mammographic findings
are identified in either breast. The parenchymal pattern is stable.

Mammographic images were processed with CAD.

Targeted ultrasound is performed, showing stable appearance of a
round, circumscribed hypoechoic mass at the [DATE] position 10 cm from
the nipple. It measures 3 x 3 x 2 mm (previously 3 x 3 x 2 mm).

A second, similar appearing mass is identified in the adjacent
tissue. It measures 2 x 2 x 1 mm. This was seen on initial
diagnostic evaluation from 02/12/2019 at which time it measured 3 x
3 x 1 mm). Findings are consistent with benign cysts.
IMPRESSION: 1. No mammographic evidence of malignancy in either breast.
2. Benign left breast masses demonstrating 2 year stability. No
further imaging follow-up required.

RECOMMENDATION:
Screening mammogram in one year.(Code:BP-C-A5L)

I have discussed the findings and recommendations with the patient.
If applicable, a reminder letter will be sent to the patient
regarding the next appointment.

BI-RADS CATEGORY  2: Benign.

## 2022-07-17 IMAGING — MG DIGITAL DIAGNOSTIC BILAT W/ TOMO W/ CAD
8 series · 8 of 24 positions shown · non-contrast
Comparison: Previous exam(s).

CLINICAL DATA: 59-year-old female presenting for annual bilateral
mammogram and final 2 year follow-up of a probably benign left
breast mass.

EXAM:
DIGITAL DIAGNOSTIC BILATERAL MAMMOGRAM WITH CAD AND TOMO
ULTRASOUND LEFT BREAST

[R CC synth-2D]
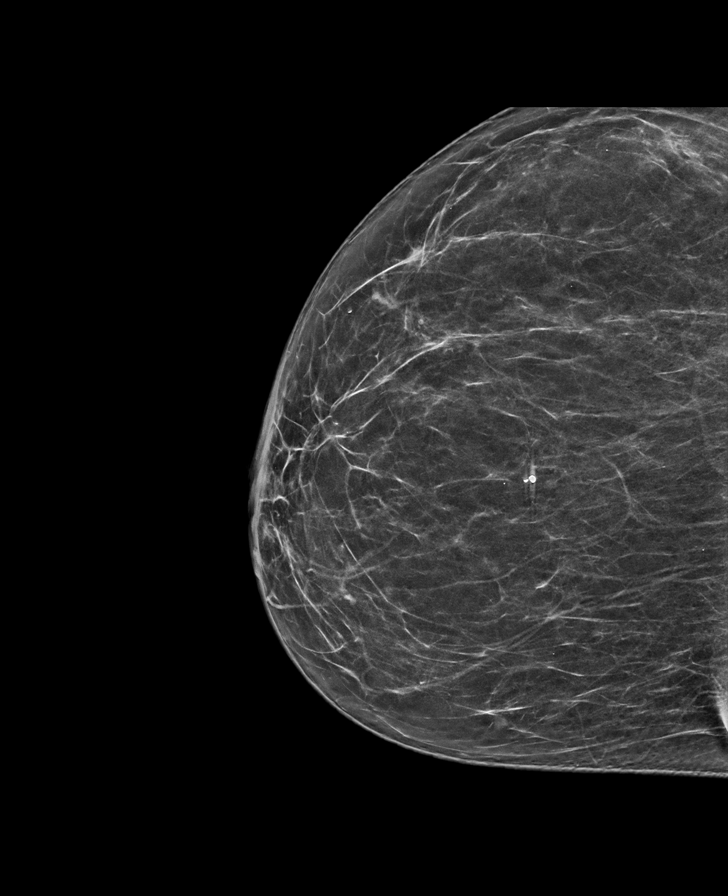

[R MLO synth-2D]
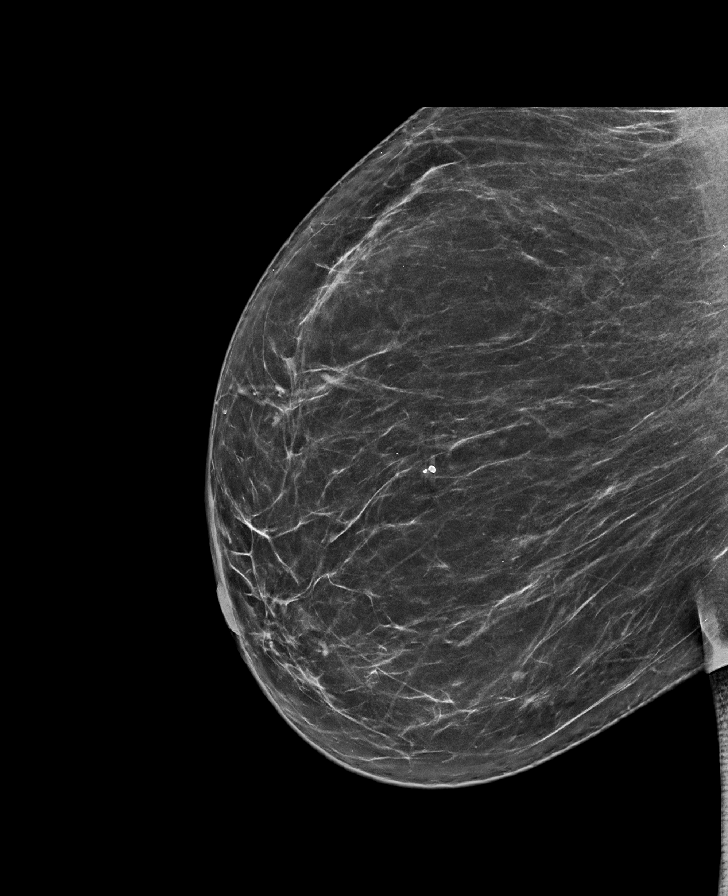

[L CC synth-2D]
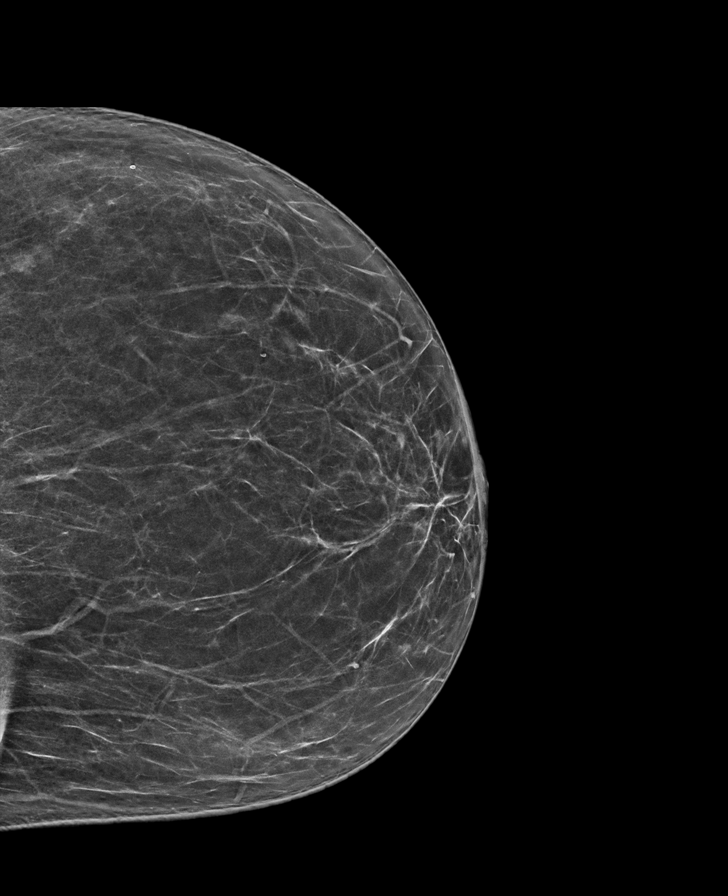

[L MLO synth-2D]
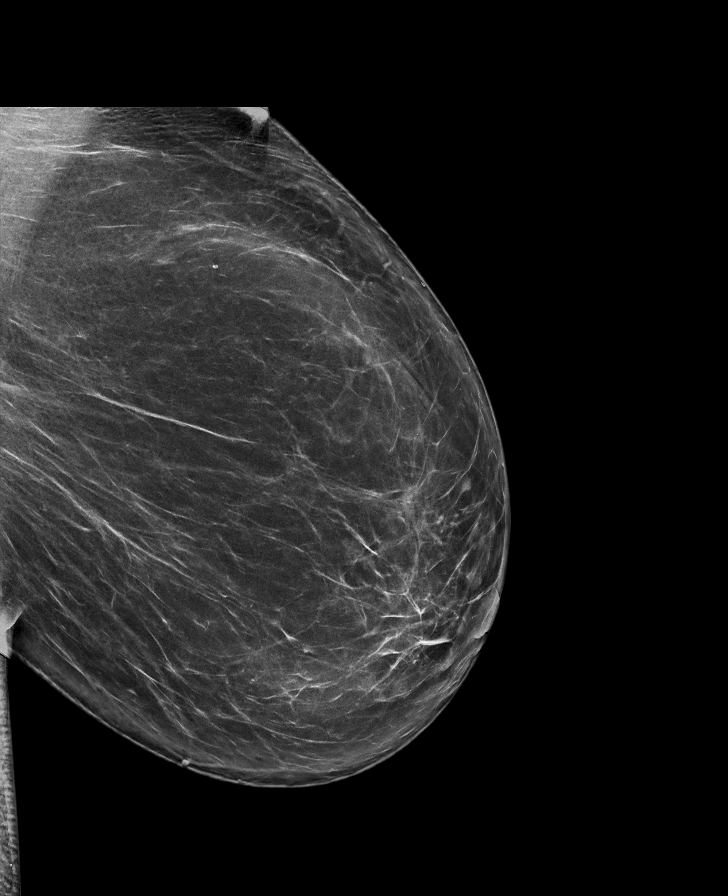

[L CC tomo · tomo slice 35/68.0]
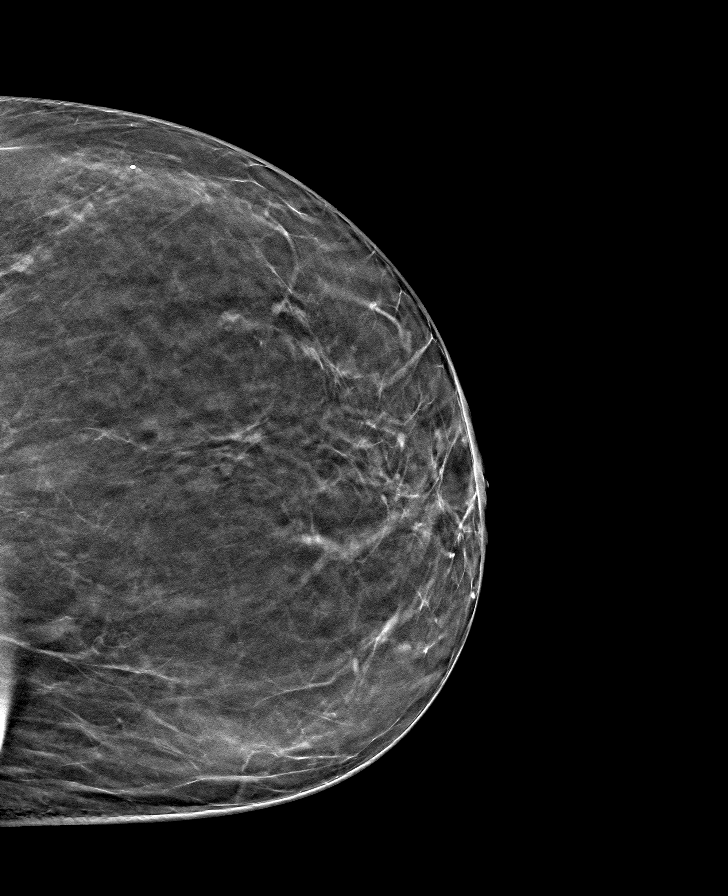

[L MLO tomo · tomo slice 44/87.0]
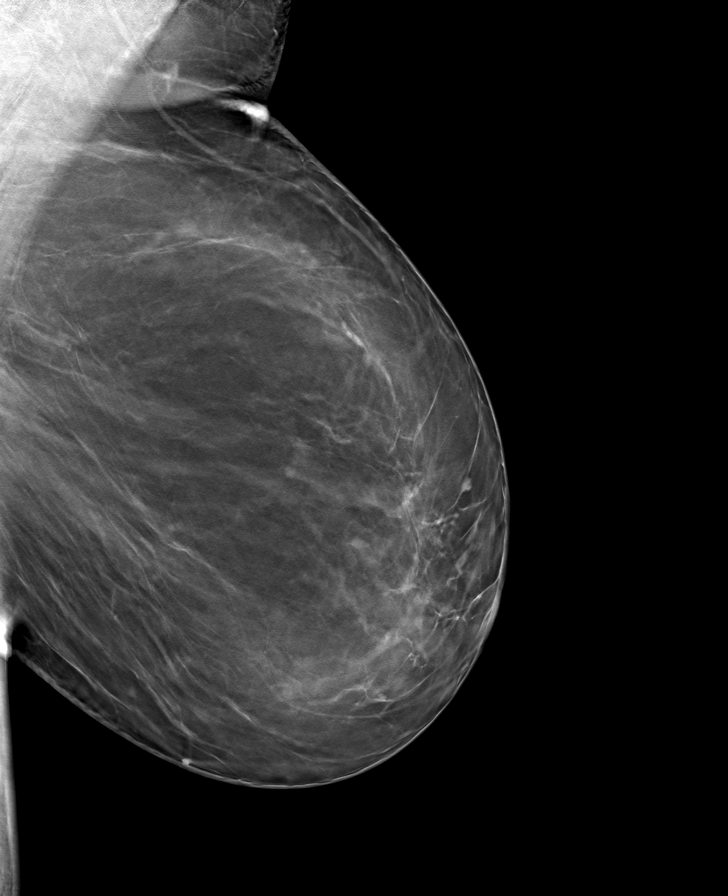

[R CC tomo · tomo slice 35/69.0]
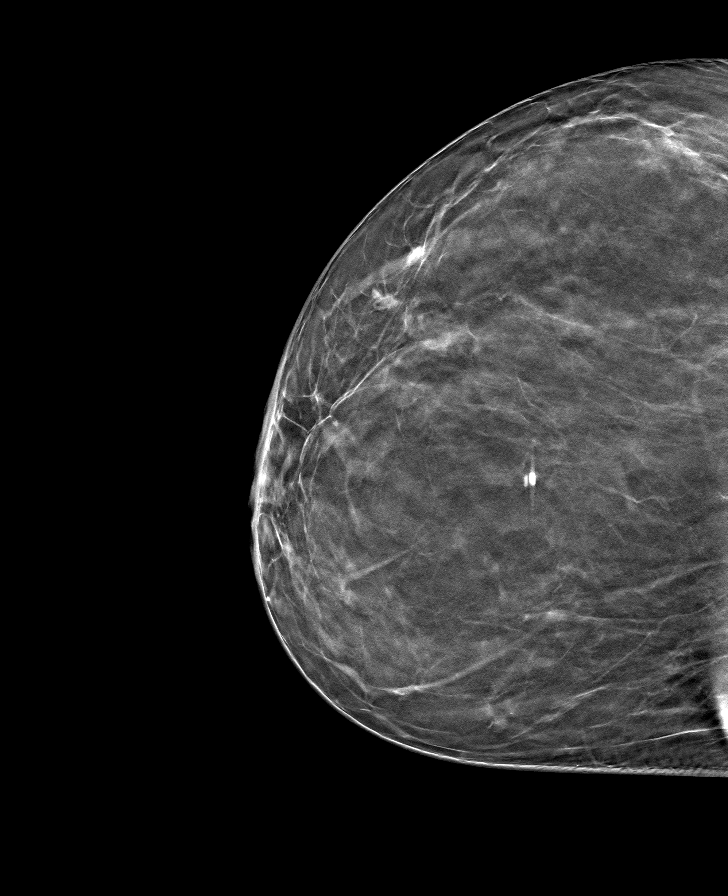

[R MLO tomo · tomo slice 41/80.0]
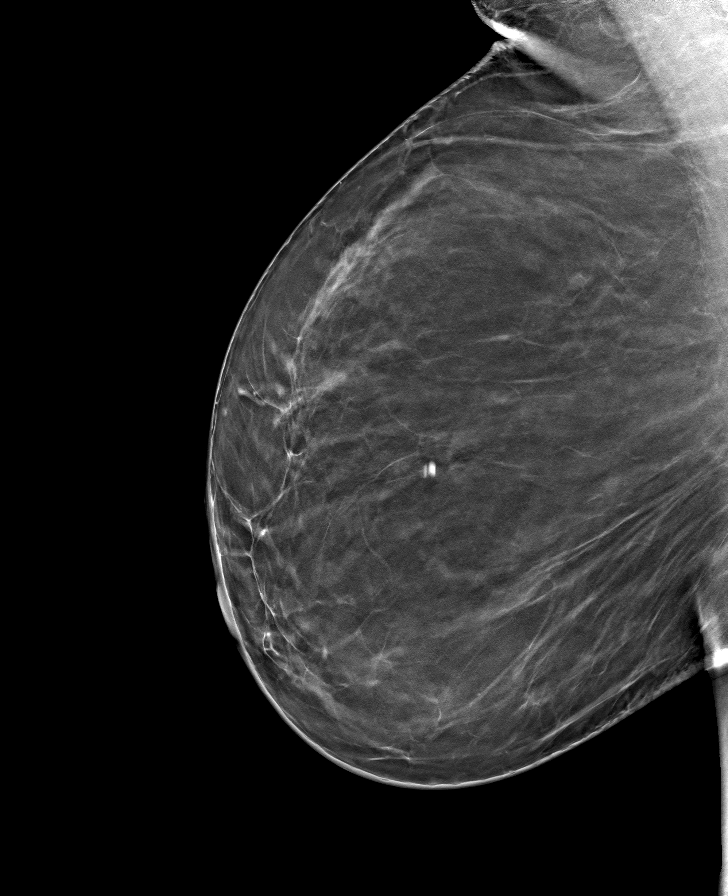

[8 of 24 positions shown; findings below may reference images not displayed]

ACR Breast Density Category b: There are scattered areas of
fibroglandular density.
FINDINGS: A small circumscribed mass in the upper outer left breast is
mammographically stable. No new or suspicious mammographic findings
are identified in either breast. The parenchymal pattern is stable.

Mammographic images were processed with CAD.

Targeted ultrasound is performed, showing stable appearance of a
round, circumscribed hypoechoic mass at the [DATE] position 10 cm from
the nipple. It measures 3 x 3 x 2 mm (previously 3 x 3 x 2 mm).

A second, similar appearing mass is identified in the adjacent
tissue. It measures 2 x 2 x 1 mm. This was seen on initial
diagnostic evaluation from 02/12/2019 at which time it measured 3 x
3 x 1 mm). Findings are consistent with benign cysts.
IMPRESSION: 1. No mammographic evidence of malignancy in either breast.
2. Benign left breast masses demonstrating 2 year stability. No
further imaging follow-up required.

RECOMMENDATION:
Screening mammogram in one year.(Code:BP-C-A5L)

I have discussed the findings and recommendations with the patient.
If applicable, a reminder letter will be sent to the patient
regarding the next appointment.

BI-RADS CATEGORY  2: Benign.

## 2022-08-01 ENCOUNTER — Other Ambulatory Visit (HOSPITAL_BASED_OUTPATIENT_CLINIC_OR_DEPARTMENT_OTHER): Payer: Self-pay

## 2022-08-01 ENCOUNTER — Ambulatory Visit (INDEPENDENT_AMBULATORY_CARE_PROVIDER_SITE_OTHER): Payer: 59 | Admitting: Medical

## 2022-08-01 VITALS — BP 126/60 | HR 76 | Temp 99.2°F | Resp 18 | Ht 66.0 in | Wt 208.0 lb

## 2022-08-01 DIAGNOSIS — J0100 Acute maxillary sinusitis, unspecified: Secondary | ICD-10-CM

## 2022-08-01 DIAGNOSIS — J45909 Unspecified asthma, uncomplicated: Secondary | ICD-10-CM | POA: Diagnosis not present

## 2022-08-01 DIAGNOSIS — H6690 Otitis media, unspecified, unspecified ear: Secondary | ICD-10-CM

## 2022-08-01 DIAGNOSIS — R059 Cough, unspecified: Secondary | ICD-10-CM | POA: Diagnosis not present

## 2022-08-01 MED ORDER — BENZONATATE 100 MG PO CAPS
100.0000 mg | ORAL_CAPSULE | Freq: Three times a day (TID) | ORAL | 0 refills | Status: DC | PRN
Start: 2022-08-01 — End: 2022-08-01

## 2022-08-01 MED ORDER — MONTELUKAST SODIUM 10 MG PO TABS
10.0000 mg | ORAL_TABLET | Freq: Every day | ORAL | 3 refills | Status: DC
  Filled 2022-08-01: qty 30, 30d supply, fill #0

## 2022-08-01 MED ORDER — METHYLPREDNISOLONE 4 MG PO TABS: ORAL_TABLET | ORAL | 0 refills | Status: DC

## 2022-08-01 MED ORDER — BENZONATATE 100 MG PO CAPS
100.0000 mg | ORAL_CAPSULE | Freq: Three times a day (TID) | ORAL | 0 refills | Status: DC | PRN
  Filled 2022-08-01: qty 30, 10d supply, fill #0

## 2022-08-01 MED ORDER — METHYLPREDNISOLONE 4 MG PO TBPK
ORAL_TABLET | ORAL | 0 refills | Status: DC
  Filled 2022-08-01 (×2): qty 21, 6d supply, fill #0

## 2022-08-01 MED ORDER — AMOXICILLIN-POT CLAVULANATE 875-125 MG PO TABS
1.0000 | ORAL_TABLET | Freq: Two times a day (BID) | ORAL | 0 refills | Status: DC
  Filled 2022-08-01: qty 20, 10d supply, fill #0

## 2022-08-01 NOTE — Progress Notes (Signed)
Subjective:    Patient ID: Paula Leblanc, female    DOB: 12-11-1960, 61 y.o.   MRN: [id number]  HPI Pt in for recent cough for about 6 weeks.   I saw pt end of august- see below in ".  "Sinus infection with appearance of bilateral ear infections. Rx augmentin antibiotic and benzonatate for cough.   For nasal congestion recommend using  kirkland nasal spray kirkland equivalent to flonase.   Some bronchitis with wheezing(mild early asthma flare). Recommend using your symbicort daily. If wheezing or cough worsening despite the above can get chest xray. Future order placed today."  Pt states she did get better after last visit. Felt well for 2 week then got recurrent. Pt states cough was throughout the day.   On 06-05-2022 cxr was clear. Cough since returned seemed worse at night. Pt denies any daily heartburn. Pt has felt pnd over past week.  Occasasional sneezing when outside.  Does get allergies in fall.   Pt is on symbicort 2 inhalation twice a day.  Some recent ear pain.   Review of Systems  Constitutional:  Negative for chills, fatigue and fever.  HENT:  Positive for congestion, ear pain, postnasal drip, sinus pressure and sinus pain. Negative for dental problem and drooling.   Respiratory:  Positive for cough and wheezing. Negative for chest tightness and shortness of breath.   Cardiovascular:  Negative for chest pain and palpitations.  Gastrointestinal:  Negative for abdominal pain.  Genitourinary:  Negative for dysuria, flank pain and frequency.  Musculoskeletal:  Negative for back pain and gait problem.  Neurological:  Negative for dizziness and headaches.  Hematological:  Negative for adenopathy. Does not bruise/bleed easily.  Psychiatric/Behavioral:  Negative for behavioral problems and confusion.     Past Medical History:  Diagnosis Date   Allergy    Carpal tunnel syndrome, bilateral    Cataract    bil cataracts removed   Hx of adenomatous polyp of colon  09/26/2016   Intrinsic asthma 04/15/2015   Osteoarthritis of both hands    PONV (postoperative nausea and vomiting)    Seasonal allergies      Social History   Socioeconomic History   Marital status: Married    Spouse name: Not on file   Number of children: 1   Years of education: Not on file   Highest education level: Not on file  Occupational History   Occupation: Glass blower/designer, law firm  Tobacco Use   Smoking status: Never   Smokeless tobacco: Never  Vaping Use   Vaping Use: Never used  Substance and Sexual Activity   Alcohol use: No   Drug use: No   Sexual activity: Not on file  Other Topics Concern   Not on file  Social History Narrative   Married w/ Almond Lint, daughter in Sports coach of [employer]    Daughter used to live in [place], works now in [place] Strain: Not on [employer] Insecurity: Not on file  Transportation Needs: Not on file  Physical Activity: Not on file  Stress: Not on file  Social Connections: Not on file  Intimate Partner Violence: Not on file    Past Surgical History:  Procedure Laterality Date   BREAST EXCISIONAL BIOPSY Left    CARPAL TUNNEL RELEASE Right 08/19/2018   Procedure: RIGHT CARPAL [place];  Surgeon: Daryll Brod, MD;  Location: [place];  Service: Orthopedics;  Laterality: Right;   CARPAL TUNNEL RELEASE Left 12/09/2018   Procedure: CARPAL TUNNEL RELEASE;  Surgeon: Daryll Brod, MD;  Location: [place];  Service: Orthopedics;  Laterality: Left;   CATARACT EXTRACTION Bilateral    L 2004, R 2006   CHOLECYSTECTOMY     cyst removed left breast     TOTAL HIP ARTHROPLASTY Left 05/2021   TOTAL HIP ARTHROPLASTY Right 08/2021   TRIGGER FINGER RELEASE Right 08/19/2018   Procedure: RELEASE RIGHT INDEX TRIGGER FINGER/A-1 PULLEY;  Surgeon: Daryll Brod, MD;  Location: [place];  Service: Orthopedics;  Laterality: Right;   WISDOM TOOTH  EXTRACTION      Family History  Problem Relation Age of Onset   Colon cancer Father 71       x2 89's 74   Diabetes Father    Heart attack Father 55   Stroke Father 17   Hyperlipidemia Mother    Osteoarthritis Mother    Melanoma Mother    Breast cancer Neg Hx    Esophageal cancer Neg Hx    Pancreatic cancer Neg Hx    Rectal cancer Neg Hx    Stomach cancer Neg Hx     Allergies  Allergen Reactions   Erythromycin Other (See Comments)    REACTION: Confusion   Sulfonamide Derivatives Other (See Comments)    Pt reported sisters x 4 have had reaction to SULFA, so pt's DR. in [place] said to avoid taking SULFA.   Tape Rash    Current Outpatient Medications on File Prior to Visit  Medication Sig Dispense Refill   azelastine (ASTELIN) 0.1 % nasal spray Place 1-2 sprays into both nostrils 2 (two) times daily as needed for rhinitis or allergies. Use in each nostril as directed     budesonide-formoterol (SYMBICORT) 160-4.5 MCG/ACT inhaler Inhale 2 puffs into the lungs 2 (two) times daily as needed. 10.2 g 5   cholecalciferol (VITAMIN D) 1000 units tablet Take 1,000 Units by mouth daily.     COVID-19 mRNA bivalent vaccine, Moderna, (MODERNA COVID-19 BIVAL BOOSTER) 50 MCG/0.5ML injection Inject into the muscle. 0.5 mL 0   cyanocobalamin 100 MCG tablet Take 100 mcg by mouth daily.     hydrocortisone 2.5 % cream Apply topically 2 (two) times daily. 30 g 0   multivitamin (THERAGRAN) per tablet Take 1 tablet by mouth daily.     polyethylene glycol (MIRALAX / GLYCOLAX) packet Take 17 g by mouth daily as needed.     No current facility-administered medications on file prior to visit.    BP 126/60   Pulse 76   Temp 99.2 F (37.3 C)   Resp 18   Ht '5\' 6"'$  (1.676 m)   Wt 208 lb (94.3 kg)   SpO2 95%   BMI 33.57 kg/m        Objective:   Physical Exam  General- No acute distress. Pleasant patient. Neck- Full range of motion, no jvd Lungs- Clear, even and unlabored. Heart- regular  rate and rhythm. Neurologic- CNII- XII grossly intact.  Heent- maxillary sinus pressure. Both TM mild red bilaterally. Worse on rt side.        Assessment & Plan:   Patient Instructions  Sinus infection and ear infection by exam. Rx augmentin antibiotic.  Allergic rhinitis with recent increase wheezing past week(asthma flare early). Rx montelukast and 6 day taper medrol.  Benzonatate for cough.  If you cough get better but then return as before consider referral to pulmonologist.  Follow up in  10 days or sooner if needed.

## 2022-08-01 NOTE — Patient Instructions (Addendum)
Sinus infection and ear infection by exam. Rx augmentin antibiotic.  Allergic rhinitis with recent increase wheezing past week(asthma flare early). Rx montelukast and 6 day taper medrol.  Benzonatate for cough.  If you cough get better but then return as before consider referral to pulmonologist.  Follow up in 10-14  days or sooner if needed.

## 2022-08-08 ENCOUNTER — Other Ambulatory Visit (HOSPITAL_COMMUNITY): Payer: Self-pay

## 2022-08-08 ENCOUNTER — Other Ambulatory Visit: Payer: Self-pay | Admitting: Medical

## 2022-08-08 MED ORDER — BENZONATATE 100 MG PO CAPS
100.0000 mg | ORAL_CAPSULE | Freq: Three times a day (TID) | ORAL | 0 refills | Status: DC | PRN
  Filled 2022-08-08: qty 30, 10d supply, fill #0

## 2022-08-14 ENCOUNTER — Ambulatory Visit: Payer: 59 | Admitting: Medical

## 2022-10-08 ENCOUNTER — Encounter: Payer: BC Managed Care – PPO | Admitting: Internal Medicine

## 2022-10-17 ENCOUNTER — Encounter: Payer: Self-pay | Admitting: Medical

## 2022-10-17 ENCOUNTER — Ambulatory Visit (INDEPENDENT_AMBULATORY_CARE_PROVIDER_SITE_OTHER): Payer: 59 | Admitting: Medical

## 2022-10-17 VITALS — BP 130/70 | HR 77 | Resp 18 | Ht 66.0 in | Wt 219.0 lb

## 2022-10-17 DIAGNOSIS — R300 Dysuria: Secondary | ICD-10-CM

## 2022-10-17 DIAGNOSIS — R350 Frequency of micturition: Secondary | ICD-10-CM

## 2022-10-17 LAB — POCT URINALYSIS DIPSTICK
Bilirubin, UA: NEGATIVE
Glucose, UA: NEGATIVE
Ketones, UA: NEGATIVE
Leukocytes, UA: NEGATIVE
Nitrite, UA: NEGATIVE
Protein, UA: NEGATIVE
Spec Grav, UA: <=1.005 — AB (ref 1.010–1.025)
Urobilinogen, UA: 0.2 E.U./dL — AB
pH, UA: 7 (ref 5.0–8.0)

## 2022-10-17 MED ORDER — CIPROFLOXACIN HCL 500 MG PO TABS: 500.0000 mg | ORAL_TABLET | Freq: Two times a day (BID) | ORAL | 0 refills | Status: DC

## 2022-10-17 NOTE — Progress Notes (Signed)
Subjective:    Patient ID: Paula Leblanc, female    DOB: 1960/12/28, 62 y.o.   MRN: [id number]  HPI  Pt in today reporting urinary symptoms. She stats last 10 days her bladder always feels full. Having to urinate frequently.  Dysuria- sporadic pain on urination. Frequent urination-yes(5-6 times a day recently) Hesitancy-no Suprapubic pressure- Fever-no chills-no Nausea-mild nausea Vomiting-no CVA pain-slight pain over left dva  History of UTI-years ago had uti when she was in her 75's Gross hematuria- no   Hx of blood in urine constantly over the years. Pt had work up ad urolgoist did cystoscopy and told her she had negative work up and some small percentage of pt have blood in urine.  Non smoker.  Review of Systems  Constitutional:  Negative for chills, fatigue and fever.  Respiratory:  Negative for cough, chest tightness and wheezing.   Cardiovascular:  Negative for chest pain and palpitations.  Gastrointestinal:  Negative for abdominal pain, blood in stool and diarrhea.  Genitourinary:  Positive for frequency. Negative for dysuria, hematuria, pelvic pain and urgency.  Musculoskeletal:  Negative for back pain.  Neurological:  Negative for dizziness and headaches.  Hematological:  Negative for adenopathy. Does not bruise/bleed easily.  Psychiatric/Behavioral:  Negative for self-injury.     Past Medical History:  Diagnosis Date   Allergy    Carpal tunnel syndrome, bilateral    Cataract    bil cataracts removed   Hx of adenomatous polyp of colon 09/26/2016   Intrinsic asthma 04/15/2015   Osteoarthritis of both hands    PONV (postoperative nausea and vomiting)    Seasonal allergies      Social History   Socioeconomic History   Marital status: Married    Spouse name: Not on file   Number of children: 1   Years of education: Not on file   Highest education level: Not on file  Occupational History   Occupation: Glass blower/designer, law firm  Tobacco Use   Smoking  status: Never   Smokeless tobacco: Never  Vaping Use   Vaping Use: Never used  Substance and Sexual Activity   Alcohol use: No   Drug use: No   Sexual activity: Not on file  Other Topics Concern   Not on file  Social History Narrative   Married w/ Almond Lint, daughter in Sports coach of [employer]    Daughter used to live in [place], works now in [place] Strain: Not on [employer] Insecurity: Not on file  Transportation Needs: Not on file  Physical Activity: Not on file  Stress: Not on file  Social Connections: Not on file  Intimate Partner Violence: Not on file    Past Surgical History:  Procedure Laterality Date   BREAST EXCISIONAL BIOPSY Left    CARPAL TUNNEL RELEASE Right 08/19/2018   Procedure: RIGHT CARPAL [place];  Surgeon: Daryll Brod, MD;  Location: [place];  Service: Orthopedics;  Laterality: Right;   CARPAL TUNNEL RELEASE Left 12/09/2018   Procedure: CARPAL TUNNEL RELEASE;  Surgeon: Daryll Brod, MD;  Location: [place];  Service: Orthopedics;  Laterality: Left;   CATARACT EXTRACTION Bilateral    L 2004, R 2006   CHOLECYSTECTOMY     cyst removed left breast     TOTAL HIP ARTHROPLASTY Left 05/2021   TOTAL HIP ARTHROPLASTY Right 08/2021   TRIGGER FINGER RELEASE Right 08/19/2018   Procedure: RELEASE RIGHT  INDEX TRIGGER FINGER/A-1 PULLEY;  Surgeon: Daryll Brod, MD;  Location: [place];  Service: Orthopedics;  Laterality: Right;   WISDOM TOOTH EXTRACTION      Family History  Problem Relation Age of Onset   Colon cancer Father 31       x2 70's 74   Diabetes Father    Heart attack Father 67   Stroke Father 47   Hyperlipidemia Mother    Osteoarthritis Mother    Melanoma Mother    Breast cancer Neg Hx    Esophageal cancer Neg Hx    Pancreatic cancer Neg Hx    Rectal cancer Neg Hx    Stomach cancer Neg Hx     Allergies  Allergen Reactions   Erythromycin  Other (See Comments)    REACTION: Confusion   Sulfonamide Derivatives Other (See Comments)    Pt reported sisters x 4 have had reaction to SULFA, so pt's DR. in [place] said to avoid taking SULFA.   Tape Rash    Current Outpatient Medications on File Prior to Visit  Medication Sig Dispense Refill   azelastine (ASTELIN) 0.1 % nasal spray Place 1-2 sprays into both nostrils 2 (two) times daily as needed for rhinitis or allergies. Use in each nostril as directed     budesonide-formoterol (SYMBICORT) 160-4.5 MCG/ACT inhaler Inhale 2 puffs into the lungs 2 (two) times daily as needed. 10.2 g 5   cholecalciferol (VITAMIN D) 1000 units tablet Take 1,000 Units by mouth daily.     cyanocobalamin 100 MCG tablet Take 100 mcg by mouth daily.     montelukast (SINGULAIR) 10 MG tablet Take 1 tablet (10 mg total) by mouth at bedtime. 30 tablet 3   multivitamin (THERAGRAN) per tablet Take 1 tablet by mouth daily.     polyethylene glycol (MIRALAX / GLYCOLAX) packet Take 17 g by mouth daily as needed.     No current facility-administered medications on file prior to visit.    BP (!) 150/70   Pulse 77   Resp 18   Ht '5\' 6"'$  (1.676 m)   Wt 219 lb (99.3 kg)   SpO2 96%   BMI 35.35 kg/m        Objective:   Physical Exam  General Mental Status- Alert. General Appearance- Not in acute distress.   Skin General: Color- Normal Color. Moisture- Normal Moisture.  Neck Carotid Arteries- Normal color. Moisture- Normal Moisture. No carotid bruits. No JVD.  Chest and Lung Exam Auscultation: Breath Sounds:-Normal.  Cardiovascular Auscultation:Rythm- Regular. Murmurs & Other Heart Sounds:Auscultation of the heart reveals- No Murmurs.  Abdomen Inspection:-Inspeection Normal. Palpation/Percussion:Note:No mass. Palpation and Percussion of the abdomen reveal- Non Tender, Non Distended + BS, no rebound or guarding.  Neurologic Cranial Nerve exam:- CN III-XII intact(No nystagmus), symmetric  smile. Strength:- 5/5 equal and symmetric strength both upper and lower extremities.       Assessment & Plan:   Patient Instructions  Frequent urination for one week, mild suprapubic tenderness and faint left cva tenderness. Pain not kidney stone like but hx of chronic blood in urine worked up by urologist in past.  Under scenario best to rx cipro 500 mg antibiotic for 3 days pending urine culture result.  If your left cva area pain worsens or if flank pain then would recommend ct renal stone  Follow up date to be determined after culture review. Also keep follow regular follow up with pcp upcoming.    Mackie Pai, PA-C

## 2022-10-17 NOTE — Patient Instructions (Addendum)
Frequent urination for one week, mild suprapubic tenderness and faint left cva tenderness. Pain not kidney stone like but hx of chronic blood in urine worked up by urologist in past.  Under scenario best to rx cipro 500 mg antibiotic for 3 days pending urine culture result.  If your left cva area pain worsens or if flank pain then would recommend ct renal stone  Follow up date to be determined after culture review. Also keep follow regular follow up with pcp upcoming.

## 2022-10-18 LAB — URINE CULTURE
MICRO NUMBER:: 14439038
SPECIMEN QUALITY:: ADEQUATE

## 2022-10-19 ENCOUNTER — Encounter: Payer: Self-pay | Admitting: Medical

## 2022-11-15 ENCOUNTER — Ambulatory Visit (INDEPENDENT_AMBULATORY_CARE_PROVIDER_SITE_OTHER): Payer: 59 | Admitting: Internal Medicine

## 2022-11-15 ENCOUNTER — Encounter: Payer: Self-pay | Admitting: Internal Medicine

## 2022-11-15 VITALS — BP 116/64 | HR 74 | Temp 97.7°F | Resp 18 | Ht 66.0 in | Wt 214.1 lb

## 2022-11-15 DIAGNOSIS — R399 Unspecified symptoms and signs involving the genitourinary system: Secondary | ICD-10-CM

## 2022-11-15 DIAGNOSIS — Z780 Asymptomatic menopausal state: Secondary | ICD-10-CM | POA: Diagnosis not present

## 2022-11-15 DIAGNOSIS — Z0000 Encounter for general adult medical examination without abnormal findings: Secondary | ICD-10-CM | POA: Diagnosis not present

## 2022-11-15 DIAGNOSIS — Z8249 Family history of ischemic heart disease and other diseases of the circulatory system: Secondary | ICD-10-CM

## 2022-11-15 DIAGNOSIS — Z1231 Encounter for screening mammogram for malignant neoplasm of breast: Secondary | ICD-10-CM

## 2022-11-15 DIAGNOSIS — R739 Hyperglycemia, unspecified: Secondary | ICD-10-CM

## 2022-11-15 DIAGNOSIS — Z01419 Encounter for gynecological examination (general) (routine) without abnormal findings: Secondary | ICD-10-CM

## 2022-11-15 LAB — URINALYSIS, ROUTINE W REFLEX MICROSCOPIC
Bilirubin Urine: NEGATIVE
Ketones, ur: NEGATIVE
Leukocytes,Ua: NEGATIVE
Nitrite: NEGATIVE
Specific Gravity, Urine: 1.01 (ref 1.000–1.030)
Total Protein, Urine: NEGATIVE
Urine Glucose: NEGATIVE
Urobilinogen, UA: 0.2 (ref 0.0–1.0)
pH: 6.5 (ref 5.0–8.0)

## 2022-11-15 LAB — COMPREHENSIVE METABOLIC PANEL
ALT: 23 U/L (ref 0–35)
AST: 18 U/L (ref 0–37)
Albumin: 4.2 g/dL (ref 3.5–5.2)
Alkaline Phosphatase: 69 U/L (ref 39–117)
BUN: 17 mg/dL (ref 6–23)
CO2: 34 mEq/L — ABNORMAL HIGH (ref 19–32)
Calcium: 10.1 mg/dL (ref 8.4–10.5)
Chloride: 109 mEq/L (ref 96–112)
Creatinine, Ser: 0.77 mg/dL (ref 0.40–1.20)
GFR: 81.78 mL/min (ref >60.00)
Glucose, Bld: 104 mg/dL — ABNORMAL HIGH (ref 70–99)
Potassium: 4.5 mEq/L (ref 3.5–5.1)
Sodium: 148 mEq/L — ABNORMAL HIGH (ref 135–145)
Total Bilirubin: 0.5 mg/dL (ref 0.2–1.2)
Total Protein: 6.8 g/dL (ref 6.0–8.3)

## 2022-11-15 LAB — CBC WITH DIFFERENTIAL/PLATELET
Basophils Absolute: 0.1 10*3/uL (ref 0.0–0.1)
Basophils Relative: 1.1 % (ref 0.0–3.0)
Eosinophils Absolute: 0.2 10*3/uL (ref 0.0–0.7)
Eosinophils Relative: 3.2 % (ref 0.0–5.0)
HCT: 44.7 % (ref 36.0–46.0)
Hemoglobin: 14.7 g/dL (ref 12.0–15.0)
Lymphocytes Relative: 21.7 % (ref 12.0–46.0)
Lymphs Abs: 1.7 10*3/uL (ref 0.7–4.0)
MCHC: 32.9 g/dL (ref 30.0–36.0)
MCV: 90.2 fl (ref 78.0–100.0)
Monocytes Absolute: 0.5 10*3/uL (ref 0.1–1.0)
Monocytes Relative: 6.1 % (ref 3.0–12.0)
Neutro Abs: 5.2 10*3/uL (ref 1.4–7.7)
Neutrophils Relative %: 67.9 % (ref 43.0–77.0)
Platelets: 247 10*3/uL (ref 150.0–400.0)
RBC: 4.95 Mil/uL (ref 3.87–5.11)
RDW: 13.5 % (ref 11.5–15.5)
WBC: 7.7 10*3/uL (ref 4.0–10.5)

## 2022-11-15 LAB — LIPID PANEL
Cholesterol: 216 mg/dL — ABNORMAL HIGH (ref 0–200)
HDL: 41.9 mg/dL (ref >39.00)
LDL Cholesterol: 137 mg/dL — ABNORMAL HIGH (ref 0–99)
NonHDL: 173.62
Total CHOL/HDL Ratio: 5
Triglycerides: 184 mg/dL — ABNORMAL HIGH (ref 0.0–149.0)
VLDL: 36.8 mg/dL (ref 0.0–40.0)

## 2022-11-15 LAB — HEMOGLOBIN A1C: Hgb A1c MFr Bld: 6 % (ref 4.6–6.5)

## 2022-11-15 NOTE — Assessment & Plan Note (Signed)
Here for CPX Hyperglycemia: Checking A1c Asthma: Well-controlled LUTS: As described above, send UA urine culture, she wonders about seen a urologist, rec to see gynecology first. FH CAD: Asymptomatic, checking a cholesterol level. Consider a  coronary calcium score. RTC 6 months

## 2022-11-15 NOTE — Patient Instructions (Addendum)
We are referring you to the gynecologist in this building.  GO TO THE LAB : Get the blood work     [place], [place] Come back for   checkup in 6 months     STOP BY THE FIRST FLOOR: Arrange a bone density test and mammogram   Vaccines I recommend:  Shingrix (shingles) RSV vaccine    "[place] of attorney" ,  "Living will" (Advance care planning documents)  If you already have a living will or healthcare power of attorney, is recommended you bring the copy to be scanned in your chart.   The document will be available to all the doctors you see in the system.  Advance care planning is a process that supports adults in  understanding and sharing their preferences regarding future medical care.  The patient's preferences are recorded in documents called Advance Directives and the can be modified at any time while the patient is in full mental capacity.   If you don't have one, please consider create one.      More information at: [email or web address]

## 2022-11-15 NOTE — Progress Notes (Signed)
Subjective:    Patient ID: Paula Leblanc, female    DOB: 1960-11-12, 62 y.o.   MRN: [id number]  DOS:  11/15/2022 Type of visit - description: CPX  Here for CPX. In general feeling well. Reports some difficulty emptying her bladder completely.  Occasional dysuria.  No gross hematuria. Recently treated for a UTI, urine culture with low bacteria count.   Review of Systems  Other than above, a 14 point review of systems is negative      Past Medical History:  Diagnosis Date   Allergy    Carpal tunnel syndrome, bilateral    Cataract    bil cataracts removed   Hx of adenomatous polyp of colon 09/26/2016   Intrinsic asthma 04/15/2015   Osteoarthritis of both hands    PONV (postoperative nausea and vomiting)    Seasonal allergies     Past Surgical History:  Procedure Laterality Date   BREAST EXCISIONAL BIOPSY Left    CARPAL TUNNEL RELEASE Right 08/19/2018   Procedure: RIGHT CARPAL TUNNEL RELEASE;  Surgeon: Daryll Brod, MD;  Location: [facility];  Service: Orthopedics;  Laterality: Right;   CARPAL TUNNEL RELEASE Left 12/09/2018   Procedure: CARPAL TUNNEL RELEASE;  Surgeon: Daryll Brod, MD;  Location: [place];  Service: Orthopedics;  Laterality: Left;   CATARACT EXTRACTION Bilateral    L 2004, R 2006   CHOLECYSTECTOMY     cyst removed left breast     TOTAL HIP ARTHROPLASTY Left 05/2021   TOTAL HIP ARTHROPLASTY Right 08/2021   TRIGGER FINGER RELEASE Right 08/19/2018   Procedure: RELEASE RIGHT INDEX TRIGGER FINGER/A-1 PULLEY;  Surgeon: Daryll Brod, MD;  Location: [place];  Service: Orthopedics;  Laterality: Right;   WISDOM TOOTH EXTRACTION     Social History   Socioeconomic History   Marital status: Married    Spouse name: Not on file   Number of children: 1   Years of education: Not on file   Highest education level: Not on file  Occupational History   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  Tobacco Use    Smoking status: Never   Smokeless tobacco: Never  Vaping Use   Vaping Use: Never used  Substance and Sexual Activity   Alcohol use: No   Drug use: No   Sexual activity: Not on file  Other Topics Concern   Not on file  Social History Narrative   Married w/ Armoni Carte, daughter in Sports coach of [employer]    Daughter used to live in [place], works now in [place] Strain: Not on [employer] Insecurity: Not on file  Transportation Needs: Not on file  Physical Activity: Not on file  Stress: Not on file  Social Connections: Not on file  Intimate Partner Violence: Not on file    Current Outpatient Medications  Medication Instructions   azelastine (ASTELIN) 0.1 % nasal spray 1-2 sprays, Each Nare, 2 times daily PRN, Use in each nostril as directed    budesonide-formoterol (SYMBICORT) 160-4.5 MCG/ACT inhaler 2 puffs, Inhalation, 2 times daily PRN   cholecalciferol (VITAMIN D) 1,000 Units, Oral, Daily   cyanocobalamin 100 mcg, Oral, Daily,     montelukast (SINGULAIR) 10 mg, Oral, Daily at bedtime   multivitamin (THERAGRAN) per tablet 1 tablet, Oral, Daily,     polyethylene glycol (MIRALAX / GLYCOLAX) 17 g, Oral, Daily PRN       Objective:  Physical Exam BP 116/64   Pulse 74   Temp 97.7 F (36.5 C) (Oral)   Resp 18   Ht 5' 6"$  (1.676 m)   Wt 214 lb 2 oz (97.1 kg)   SpO2 97%   BMI 34.56 kg/m  General: Well developed, NAD, BMI noted Neck: No  thyromegaly  HEENT:  Normocephalic . Face symmetric, atraumatic Lungs:  CTA B Normal respiratory effort, no intercostal retractions, no accessory muscle use. Heart: RRR,  no murmur.  Abdomen:  Not distended, soft, non-tender. No rebound or rigidity.   Lower extremities: no pretibial edema bilaterally  Skin: Exposed areas without rash. Not pale. Not jaundice Neurologic:  alert & oriented X3.  Speech normal, gait appropriate for age and unassisted Strength symmetric and appropriate  for age.  Psych: Cognition and judgment appear intact.  Cooperative with normal attention span and concentration.  Behavior appropriate. No anxious or depressed appearing.     Assessment    Assessment  Hyperglycemia Asthma MSK -CTS B -DJD  -hands, saw ortho before, had XRs -spine stenosis, Dx per Dr Nelva Bush via MRI 11-2016 ( Mild spinal stenosis L4-5. Grade 1 anterior slip with severe facet degeneratio) Menopause -- onset ~ 62 y/o FH CAD F age 30  PLAN Here for CPX Hyperglycemia: Checking A1c Asthma: Well-controlled LUTS: As described above, send UA urine culture, she wonders about seen a urologist, rec to see gynecology first. FH CAD: Asymptomatic, checking a cholesterol level. Consider a  coronary calcium score. RTC 6 months

## 2022-11-15 NOTE — Assessment & Plan Note (Signed)
-   Td 07-2016 - pnm shot 2017, prevnar:07-2015 - shingrix: d/w pt  -covid vax: utd 06-2022  -had a  flu shot   -Female care :  LOV w/ gyn 07-2020 , refer to gyn  MMG: over due, referral sent  DEXA: never done referral sent -CCS:  cscope 11-2010 , tics; cscope 08-2016, 1 polyp, next 09-2023 per GI letter  -Labs:   CMP FLP CBC A1c UA urine culture -Diet exercise: Counseled - Healthcare POA: See AVS

## 2022-11-16 LAB — URINE CULTURE
MICRO NUMBER:: 14570048
Result:: NO GROWTH
SPECIMEN QUALITY:: ADEQUATE

## 2022-11-16 NOTE — Addendum Note (Signed)
Addended byDamita Dunnings D on: 11/16/2022 01:41 PM   Modules accepted: Orders

## 2022-11-20 ENCOUNTER — Encounter: Payer: Self-pay | Admitting: General Practice

## 2022-11-27 ENCOUNTER — Ambulatory Visit (HOSPITAL_BASED_OUTPATIENT_CLINIC_OR_DEPARTMENT_OTHER): Admission: RE | Admit: 2022-11-27 | Payer: 59 | Source: Ambulatory Visit

## 2022-11-27 ENCOUNTER — Encounter (HOSPITAL_BASED_OUTPATIENT_CLINIC_OR_DEPARTMENT_OTHER): Payer: Self-pay

## 2022-11-28 ENCOUNTER — Ambulatory Visit (HOSPITAL_BASED_OUTPATIENT_CLINIC_OR_DEPARTMENT_OTHER)
Admission: RE | Admit: 2022-11-28 | Discharge: 2022-11-28 | Disposition: A | Discharge status: Home / Self Care | Payer: 59 | Source: Ambulatory Visit | Attending: Internal Medicine | Admitting: Internal Medicine

## 2022-11-28 ENCOUNTER — Ambulatory Visit (HOSPITAL_BASED_OUTPATIENT_CLINIC_OR_DEPARTMENT_OTHER)
Admission: RE | Admit: 2022-11-28 | Discharge: 2022-11-28 | Disposition: A | Discharge status: Home / Self Care | Payer: Self-pay | Source: Ambulatory Visit | Attending: Internal Medicine | Admitting: Internal Medicine

## 2022-11-28 ENCOUNTER — Encounter (HOSPITAL_BASED_OUTPATIENT_CLINIC_OR_DEPARTMENT_OTHER): Payer: Self-pay

## 2022-11-28 DIAGNOSIS — Z1231 Encounter for screening mammogram for malignant neoplasm of breast: Secondary | ICD-10-CM | POA: Diagnosis not present

## 2022-11-28 DIAGNOSIS — Z780 Asymptomatic menopausal state: Secondary | ICD-10-CM | POA: Diagnosis present

## 2022-11-28 DIAGNOSIS — Z8249 Family history of ischemic heart disease and other diseases of the circulatory system: Secondary | ICD-10-CM | POA: Insufficient documentation

## 2023-01-10 ENCOUNTER — Ambulatory Visit (INDEPENDENT_AMBULATORY_CARE_PROVIDER_SITE_OTHER): Payer: 59 | Admitting: Family Medicine

## 2023-01-10 ENCOUNTER — Other Ambulatory Visit (HOSPITAL_COMMUNITY)
Admission: RE | Admit: 2023-01-10 | Discharge: 2023-01-10 | Disposition: A | Discharge status: Home / Self Care | Payer: Self-pay | Source: Ambulatory Visit | Attending: Family Medicine | Admitting: Family Medicine

## 2023-01-10 ENCOUNTER — Encounter: Payer: Self-pay | Admitting: Family Medicine

## 2023-01-10 VITALS — BP 111/54 | HR 68 | Ht 66.0 in | Wt 206.0 lb

## 2023-01-10 DIAGNOSIS — Z1339 Encounter for screening examination for other mental health and behavioral disorders: Secondary | ICD-10-CM | POA: Diagnosis not present

## 2023-01-10 DIAGNOSIS — R339 Retention of urine, unspecified: Secondary | ICD-10-CM

## 2023-01-10 DIAGNOSIS — Z01419 Encounter for gynecological examination (general) (routine) without abnormal findings: Secondary | ICD-10-CM | POA: Diagnosis not present

## 2023-01-10 DIAGNOSIS — Z1211 Encounter for screening for malignant neoplasm of colon: Secondary | ICD-10-CM

## 2023-01-10 NOTE — Progress Notes (Signed)
ANNUAL EXAM Patient name: Paula Leblanc MRN [id number]  Date of birth: 11/28/1960 Chief Complaint:   Annual Exam  History of Present Illness:   Paula Leblanc is a 62 y.o.  G39P1001  female  being seen today for a routine annual exam.  Current complaints: feels like she doesn't she completely empties her bladder. She will use the bathroom, get up, then returns 30 minutes later to urinate a little more. No bulging, pain, etc.  No LMP recorded. Patient is postmenopausal.    Last pap 2017. Results were:  normal . H/O abnormal pap: no Last mammogram: 2023. Results were: normal. Family h/o breast cancer: no Last colonoscopy:      01/10/2023    8:27 AM 11/15/2022    8:14 AM 10/06/2021    8:12 AM 10/05/2020    8:10 AM 08/04/2019    9:09 AM  Depression screen PHQ 2/9  Decreased Interest 0 0 0 0 0  Down, Depressed, Hopeless 0 0 0 0 0  PHQ - 2 Score 0 0 0 0 0  Altered sleeping 0      Tired, decreased energy 0      Change in appetite 1      Feeling bad or failure about yourself  0      Trouble concentrating 0      Moving slowly or fidgety/restless 0      Suicidal thoughts 0      PHQ-9 Score 1            01/10/2023    8:27 AM  GAD 7 : Generalized Anxiety Score  Nervous, Anxious, on Edge 0  Control/stop worrying 0  Worry too much - different things 0  Trouble relaxing 0  Restless 0  Easily annoyed or irritable 1  Afraid - awful might happen 0  Total GAD 7 Score 1     Review of Systems:   Pertinent items are noted in HPI Denies any headaches, blurred vision, fatigue, shortness of breath, chest pain, abdominal pain, abnormal vaginal discharge/itching/odor/irritation, problems with periods, bowel movements, urination, or intercourse unless otherwise stated above. Pertinent History Reviewed:  Reviewed past medical,surgical, social and family history.  Reviewed problem list, medications and allergies. Physical Assessment:   Vitals:   01/10/23 0806  BP: (!) 111/54  Pulse: 68   Weight: 206 lb (93.4 kg)  Height: 5\' 6"  (1.676 m)  Body mass index is 33.25 kg/m.        Physical Examination:   General appearance - well appearing, and in no distress  Mental status - alert, oriented to person, place, and time  Psych:  She has a normal mood and affect  Skin - warm and dry, normal color, no suspicious lesions noted  Chest - effort normal, all lung fields clear to auscultation bilaterally  Heart - normal rate and regular rhythm  Neck:  midline trachea, no thyromegaly or nodules  Breasts - breasts appear normal, no suspicious masses, no skin or nipple changes or axillary nodes  Abdomen - soft, nontender, nondistended, no masses or organomegaly  Pelvic - VULVA: normal appearing vulva with no masses, tenderness or lesions  VAGINA: normal appearing vagina with normal color and discharge, no lesions  CERVIX: normal appearing cervix without discharge or lesions, no CMT  Thin prep pap is done   UTERUS: uterus is felt to be normal size, shape, consistency and nontender   ADNEXA: No adnexal masses or tenderness noted.  Extremities:  No swelling or varicosities noted  Chaperone present for exam  Assessment & Plan:  1. Well woman exam with routine gynecological exam - Cytology - PAP( [facility])  2. Incomplete bladder emptying Will refer to urogyn - Ambulatory referral to Urogynecology  3. Colon cancer screening - Ambulatory referral to Gastroenterology   Labs/procedures today: none  Orders Placed This Encounter  Procedures   Ambulatory referral to Gastroenterology   Ambulatory referral to Urogynecology    Meds: No orders of the defined types were placed in this encounter.   Follow-up: No follow-ups on file.  Levie Heritage, DO 01/10/2023 8:56 AM

## 2023-01-14 LAB — CYTOLOGY - PAP
Comment: NEGATIVE
Diagnosis: NEGATIVE
High risk HPV: NEGATIVE

## 2023-01-14 NOTE — Progress Notes (Unsigned)
[facility] New Patient Evaluation and Consultation  Referring Provider: Levie Heritage, DO PCP: Wanda Plump, MD Date of Service: 01/17/2023  SUBJECTIVE Chief Complaint: No chief complaint on file.  History of Present Illness: Paula Leblanc is a 62 y.o. {ED SANE [phone] female seen in consultation at the request of Dr. Adrian Blackwater for evaluation of incomplete bladder emptying.    ***Review of records significant for: ***  Urinary Symptoms: {urine leakage?:24754} Leaks *** time(s) per {days/wks/mos/yrs:310907}.  Pad use: {NUMBERS 1-10:18281} {pad option:24752} per day.   She {ACTION; IS/IS WUJ:81191478} bothered by her UI symptoms.  Day time voids ***.  Nocturia: *** times per night to void. Voiding dysfunction: she {empties:24755} her bladder well.  {DOES NOT does:27190::"does not"} use a catheter to empty bladder.  When urinating, she feels {urine symptoms:24756} Drinks: *** per day  UTIs: {NUMBERS 1-10:18281} UTI's in the last year.   {ACTIONS;DENIES/REPORTS:21021675::"Denies"} history of {urologic concerns:24757}  Pelvic Organ Prolapse Symptoms:                  She {denies/ admits to:24761} a feeling of a bulge the vaginal area. It has been present for {NUMBER 1-10:22536} {days/wks/mos/yrs:310907}.  She {denies/ admits to:24761} seeing a bulge.  This bulge {ACTION; IS/IS GNF:62130865} bothersome.  Bowel Symptom: Bowel movements: *** time(s) per {Time; day/week/month:13537} Stool consistency: {stool consistency:24758} Straining: {yes/no:19897}.  Splinting: {yes/no:19897}.  Incomplete evacuation: {yes/no:19897}.  She {denies/ admits to:24761} accidental bowel leakage / fecal incontinence  Occurs: *** time(s) per {Time; day/week/month:13537}  Consistency with leakage: {stool consistency:24758} Bowel regimen: {bowel regimen:24759} Last colonoscopy: Date ***, Results ***  Sexual Function Sexually active: {yes/no:19897}.  Sexual orientation: {Sexual  Orientation:[phone]} Pain with sex: {pain with sex:24762}  Pelvic Pain {denies/ admits to:24761} pelvic pain Location: *** Pain occurs: *** Prior pain treatment: *** Improved by: *** Worsened by: ***   Past Medical History:  Past Medical History:  Diagnosis Date   Allergy    Carpal tunnel syndrome, bilateral    Cataract    bil cataracts removed   Hx of adenomatous polyp of colon 09/26/2016   Intrinsic asthma 04/15/2015   Osteoarthritis of both hands    PONV (postoperative nausea and vomiting)    Seasonal allergies      Past Surgical History:   Past Surgical History:  Procedure Laterality Date   BREAST EXCISIONAL BIOPSY Left    CARPAL TUNNEL RELEASE Right 08/19/2018   Procedure: RIGHT CARPAL TUNNEL RELEASE;  Surgeon: Cindee Salt, MD;  Location: [facility];  Service: Orthopedics;  Laterality: Right;   CARPAL TUNNEL RELEASE Left 12/09/2018   Procedure: CARPAL TUNNEL RELEASE;  Surgeon: Cindee Salt, MD;  Location: [facility];  Service: Orthopedics;  Laterality: Left;   CATARACT EXTRACTION Bilateral    L 2004, R 2006   CHOLECYSTECTOMY     cyst removed left breast     TOTAL HIP ARTHROPLASTY Left 05/2021   TOTAL HIP ARTHROPLASTY Right 08/2021   TRIGGER FINGER RELEASE Right 08/19/2018   Procedure: RELEASE RIGHT INDEX TRIGGER FINGER/A-1 PULLEY;  Surgeon: Cindee Salt, MD;  Location: [facility];  Service: Orthopedics;  Laterality: Right;   WISDOM TOOTH EXTRACTION       Past OB/GYN History: G{NUMBERS 1-10:18281} P{NUMBERS 1-10:18281} Vaginal deliveries: ***,  Forceps/ Vacuum deliveries: ***, Cesarean section: *** Menopausal: {menopausal:24763} Contraception: ***. Last pap smear was ***.  Any history of abnormal pap smears: {yes/no:19897}.   Medications: She has a current medication list which includes the following prescription(s): azelastine, budesonide-formoterol,  cholecalciferol, cyanocobalamin, fexofenadine, montelukast,  multivitamin, and polyethylene glycol.   Allergies: Patient is allergic to erythromycin, sulfonamide derivatives, and tape.   Social History:  Social History   Tobacco Use   Smoking status: Never   Smokeless tobacco: Never  Vaping Use   Vaping Use: Never used  Substance Use Topics   Alcohol use: No   Drug use: No    Relationship status: {relationship status:24764} She lives with ***.   She {ACTION; IS/IS WNI:62703500} employed ***. Regular exercise: {Yes/No:304960894} History of abuse: {Yes/No:304960894}  Family History:   Family History  Problem Relation Age of Onset   Colon cancer Father 106       x2 73's 42   Diabetes Father    Heart attack Father 63   Stroke Father 37   Hyperlipidemia Mother    Osteoarthritis Mother    Melanoma Mother    Breast cancer Neg Hx    Esophageal cancer Neg Hx    Pancreatic cancer Neg Hx    Rectal cancer Neg Hx    Stomach cancer Neg Hx      Review of Systems: ROS   OBJECTIVE Physical Exam: There were no vitals filed for this visit.  Physical Exam   GU / Detailed Urogynecologic Evaluation:  Pelvic Exam: Normal external female genitalia; Bartholin's and Skene's glands normal in appearance; urethral meatus normal in appearance, no urethral masses or discharge.   CST: {gen negative/positive:315881}  Reflexes: bulbocavernosis {DESC; PRESENT/NOT PRESENT:21021351}, anocutaneous {DESC; PRESENT/NOT PRESENT:21021351} ***bilaterally.  Speculum exam reveals normal vaginal mucosa {With/Without:20273} atrophy. Cervix {exam; gyn cervix:30847}. Uterus {exam; pelvic uterus:30849}. Adnexa {exam; adnexa:12223}.    s/p hysterectomy: Speculum exam reveals normal vaginal mucosa {With/Without:20273}  atrophy and normal vaginal cuff.  Adnexa {exam; adnexa:12223}.    With apex supported, anterior compartment defect was {reduced:24765}  Pelvic floor strength {Roman # I-V:19040}/V, puborectalis {Roman # I-V:19040}/V external anal sphincter {Roman #  I-V:19040}/V  Pelvic floor musculature: Right levator {Tender/Non-tender:20250}, Right obturator {Tender/Non-tender:20250}, Left levator {Tender/Non-tender:20250}, Left obturator {Tender/Non-tender:20250}  POP-Q:   POP-Q                                               Aa                                               Ba                                                 C                                                Gh                                               Pb  tvl                                                Ap                                               Bp                                                 D      Rectal Exam:  Normal sphincter tone, {rectocele:24766} distal rectocele, enterocoele {DESC; PRESENT/NOT PRESENT:21021351}, no rectal masses, {sign of:24767} dyssynergia when asking the patient to bear down.  Post-Void Residual (PVR) by Bladder Scan: In order to evaluate bladder emptying, we discussed obtaining a postvoid residual and she agreed to this procedure.  Procedure: The ultrasound unit was placed on the patient's abdomen in the suprapubic region after the patient had voided. A PVR of *** ml was obtained by bladder scan.  Laboratory Results: @   ***I visualized the urine specimen, noting the specimen to be {urine color:24768}  ASSESSMENT AND PLAN Ms. Geng is a 62 y.o. with: No diagnosis found.    Selmer Dominion, NP   Medical Decision Making:  - Reviewed/ ordered a clinical laboratory test - Reviewed/ ordered a radiologic study - Reviewed/ ordered medicine test - Decision to obtain old records - Discussion of management of or test interpretation with an external physician / other healthcare professional  - Assessment requiring independent historian - Review and summation of prior records - Independent review of image, tracing or specimen

## 2023-01-17 ENCOUNTER — Encounter: Payer: Self-pay | Admitting: Obstetrics and Gynecology

## 2023-01-17 ENCOUNTER — Other Ambulatory Visit (HOSPITAL_COMMUNITY)
Admission: RE | Admit: 2023-01-17 | Discharge: 2023-01-17 | Disposition: A | Discharge status: Home / Self Care | Payer: 59 | Source: Ambulatory Visit | Attending: Obstetrics and Gynecology | Admitting: Obstetrics and Gynecology

## 2023-01-17 ENCOUNTER — Ambulatory Visit (INDEPENDENT_AMBULATORY_CARE_PROVIDER_SITE_OTHER): Payer: 59 | Admitting: Obstetrics and Gynecology

## 2023-01-17 VITALS — BP 112/74 | HR 71 | Ht 65.0 in | Wt 211.0 lb

## 2023-01-17 DIAGNOSIS — R339 Retention of urine, unspecified: Secondary | ICD-10-CM

## 2023-01-17 DIAGNOSIS — R350 Frequency of micturition: Secondary | ICD-10-CM

## 2023-01-17 DIAGNOSIS — N952 Postmenopausal atrophic vaginitis: Secondary | ICD-10-CM

## 2023-01-17 DIAGNOSIS — R319 Hematuria, unspecified: Secondary | ICD-10-CM

## 2023-01-17 DIAGNOSIS — N8110 Cystocele, unspecified: Secondary | ICD-10-CM | POA: Diagnosis not present

## 2023-01-17 DIAGNOSIS — R82998 Other abnormal findings in urine: Secondary | ICD-10-CM

## 2023-01-17 LAB — POCT URINALYSIS DIPSTICK
Bilirubin, UA: NEGATIVE
Glucose, UA: NEGATIVE
Ketones, UA: NEGATIVE
Nitrite, UA: NEGATIVE
Protein, UA: NEGATIVE
Spec Grav, UA: 1.025 (ref 1.010–1.025)
Urobilinogen, UA: 0.2 E.U./dL — AB
pH, UA: 6.5 (ref 5.0–8.0)

## 2023-01-17 MED ORDER — ESTRADIOL 0.1 MG/GM VA CREA: 0.5000 g | TOPICAL_CREAM | VAGINAL | 11 refills | Status: DC

## 2023-01-17 NOTE — Patient Instructions (Signed)
Use the estrogen nightly for 2 weeks then twice a week after. Put a blueberry sized amount onto the finger and rub it inside the vagina and around the external part too.

## 2023-01-17 NOTE — Progress Notes (Signed)
[facility]   Subjective:     Chief Complaint: New Patient (Initial Visit) (inhaled corticosteroids here for possible incomplete bladder emptying)  History of Present Illness: KAMARRI FISCHETTI is a 62 y.o. female with stage I pelvic organ prolapse who presents today for a pessary fitting.    Past Medical History: Patient  has a past medical history of Allergy, Carpal tunnel syndrome, bilateral, Cataract, adenomatous polyp of colon (09/26/2016), Intrinsic asthma (04/15/2015), Osteoarthritis of both hands, PONV (postoperative nausea and vomiting), and Seasonal allergies.   Past Surgical History: She  has a past surgical history that includes Cholecystectomy; Cataract extraction (Bilateral); cyst removed left breast; Wisdom tooth extraction; Breast excisional biopsy (Left); Carpal tunnel release (Right, 08/19/2018); Trigger finger release (Right, 08/19/2018); Carpal tunnel release (Left, 12/09/2018); Total hip arthroplasty (Left, 05/2021); and Total hip arthroplasty (Right, 08/2021).   Medications: She has a current medication list which includes the following prescription(s): azelastine, budesonide-formoterol, cholecalciferol, cyanocobalamin, estradiol, fexofenadine, multivitamin, and polyethylene glycol.   Allergies: Patient is allergic to erythromycin, sulfonamide derivatives, and tape.   Social History: Patient  reports that she has never smoked. She has never used smokeless tobacco. She reports that she does not drink alcohol and does not use drugs.      Objective:    BP 112/74   Pulse 71   Ht  (1.651 m)   Wt 211 lb (95.7 kg)   BMI 35.11 kg/m  Gen: No apparent distress, A&O x 3. Pelvic Exam: Normal external female genitalia; Bartholin's and Skene's glands normal in appearance; urethral meatus normal in appearance, no urethral masses or discharge.   A size #4 ring with support pessary was fitted. It was comfortable, stayed in place with valsalva and was an  appropriate size on examination, with one finger fitting between the pessary and the vaginal walls. We tied a string to it and the patient demonstrated proper removal and replacement.  POP-Q  -1                                            Aa   -1                                           Ba  -7                                              C   3                                            Gh  5                                            Pb  10  tvl   -2.5                                            Ap  -2.5                                            Bp  -9                                              D      Assessment/Plan:    Assessment: Ms. Licciardiaso is a 62 y.o. with stage I pelvic organ prolapse who presents for a pessary fitting. Plan: She was fitted with a #4 ring with support pessary. She will remove at least once weekly . She will use estrogen.   Follow-up in for urodynamics and will check pessary status then.     Selmer Dominion, NP

## 2023-01-19 LAB — URINE CULTURE: Culture: <10000 — AB

## 2023-01-24 ENCOUNTER — Encounter: Payer: Self-pay | Admitting: Obstetrics and Gynecology

## 2023-01-31 ENCOUNTER — Other Ambulatory Visit (HOSPITAL_BASED_OUTPATIENT_CLINIC_OR_DEPARTMENT_OTHER): Payer: Self-pay

## 2023-01-31 ENCOUNTER — Ambulatory Visit (HOSPITAL_BASED_OUTPATIENT_CLINIC_OR_DEPARTMENT_OTHER)
Admission: RE | Admit: 2023-01-31 | Discharge: 2023-01-31 | Disposition: A | Discharge status: Home / Self Care | Payer: 59 | Source: Ambulatory Visit | Attending: Medical | Admitting: Medical

## 2023-01-31 ENCOUNTER — Telehealth: Payer: Self-pay | Admitting: Medical

## 2023-01-31 ENCOUNTER — Ambulatory Visit (INDEPENDENT_AMBULATORY_CARE_PROVIDER_SITE_OTHER): Payer: 59 | Admitting: Medical

## 2023-01-31 VITALS — BP 140/62 | HR 70 | Temp 98.7°F | Resp 18 | Ht 65.0 in | Wt 205.0 lb

## 2023-01-31 DIAGNOSIS — J40 Bronchitis, not specified as acute or chronic: Secondary | ICD-10-CM | POA: Insufficient documentation

## 2023-01-31 DIAGNOSIS — J3489 Other specified disorders of nose and nasal sinuses: Secondary | ICD-10-CM | POA: Diagnosis not present

## 2023-01-31 DIAGNOSIS — H6690 Otitis media, unspecified, unspecified ear: Secondary | ICD-10-CM

## 2023-01-31 DIAGNOSIS — R0981 Nasal congestion: Secondary | ICD-10-CM

## 2023-01-31 DIAGNOSIS — R059 Cough, unspecified: Secondary | ICD-10-CM | POA: Diagnosis present

## 2023-01-31 LAB — POC COVID19 BINAXNOW: SARS Coronavirus 2 Ag: NEGATIVE

## 2023-01-31 MED ORDER — AMOXICILLIN-POT CLAVULANATE 875-125 MG PO TABS
1.0000 | ORAL_TABLET | Freq: Two times a day (BID) | ORAL | 0 refills | Status: DC
  Filled 2023-01-31: qty 20, 10d supply, fill #0

## 2023-01-31 MED ORDER — HYDROCODONE BIT-HOMATROP MBR 5-1.5 MG/5ML PO SOLN
5.0000 mL | Freq: Three times a day (TID) | ORAL | 0 refills | Status: DC | PRN
  Filled 2023-01-31: qty 120, 8d supply, fill #0

## 2023-01-31 MED ORDER — HYDROCODONE BIT-HOMATROP MBR 5-1.5 MG/5ML PO SOLN: 5.0000 mL | Freq: Three times a day (TID) | ORAL | 0 refills | Status: DC | PRN

## 2023-01-31 MED ORDER — AMOXICILLIN-POT CLAVULANATE 875-125 MG PO TABS: 1.0000 | ORAL_TABLET | Freq: Two times a day (BID) | ORAL | 0 refills | Status: DC

## 2023-01-31 NOTE — Progress Notes (Signed)
Subjective:    Patient ID: Paula Leblanc, female    DOB: 05/17/1961, 62 y.o.   MRN: [id number]  HPI Pt in with fever, fatigue,  some productive cough and sinus pressure. Pt states onset of symptoms over weekend. Tuesday cough got worse.   Temperature has been 100 yesterday. Tuesday was 99 degrees F.  Pt tried mucinex.   Pt has some ear pain rt side.  Minimal wheeze last night. She does have symbicort.  Symptoms for 4-5 days.  Pt wants stronger med than benzonates.  Review of Systems  Constitutional:  Positive for fever. Negative for chills and fatigue.  HENT:  Positive for congestion. Negative for ear pain.   Respiratory:  Positive for cough. Negative for choking and wheezing.   Cardiovascular:  Negative for chest pain and palpitations.  Gastrointestinal:  Negative for abdominal pain.  Genitourinary:  Negative for dyspareunia, dysuria, frequency and genital sores.  Musculoskeletal:  Negative for back pain.  Neurological:  Negative for dizziness, syncope, weakness, numbness and headaches.  Hematological:  Negative for adenopathy. Does not bruise/bleed easily.  Psychiatric/Behavioral:  Negative for behavioral problems and decreased concentration.    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, industrial/product  Tobacco Use   Smoking status: Never   Smokeless tobacco: Never  Vaping Use   Vaping Use: Never used  Substance and Sexual Activity    Alcohol use: No   Drug use: No   Sexual activity: Not Currently    Birth control/protection: Post-menopausal  Other Topics Concern   Not on file  Social History Narrative   Married w/ Caitlain Tweed, daughter in Social worker of [employer]    Daughter used to live in [place], works now in [employer]    Social Determinants of Health   Financial Resource Strain: Low Risk  (01/30/2023)   Overall Financial Resource Strain (CARDIA)    Difficulty of Paying Living Expenses: Not very hard  Food Insecurity: No Food Insecurity (01/30/2023)   Hunger Vital Sign    Worried About Running Out of Food in the Last Year: Never true    Ran Out of Food in the Last Year: Never true  Transportation Needs: No Transportation Needs (01/30/2023)   PRAPARE - Administrator, Civil Service (Medical): No    Lack of Transportation (Non-Medical): No  Physical Activity: Insufficiently Active (01/30/2023)   Exercise Vital Sign    Days of Exercise per Week: 2 days    Minutes of Exercise per Session: 20 min  Stress: No Stress Concern Present (01/30/2023)   [employer] of Occupational Health - Occupational Stress Questionnaire    Feeling of Stress : Only a little  Social Connections: Unknown (01/30/2023)   Social Connection and Isolation Panel [NHANES]    Frequency of Communication with Friends and Family: Once a week    Frequency of  Social Gatherings with Friends and Family: Once a week    Attends Religious Services: Patient declined    Database administrator or Organizations: No    Attends Engineer, structural: Not on file    Marital Status: Married  Catering manager Violence: Not on file    Past Surgical History:  Procedure Laterality Date   BREAST EXCISIONAL BIOPSY Left    CARPAL TUNNEL RELEASE Right 08/19/2018   Procedure: RIGHT CARPAL TUNNEL RELEASE;  Surgeon: Cindee Salt, MD;  Location: [facility];  Service: Orthopedics;  Laterality: Right;   CARPAL TUNNEL RELEASE Left 12/09/2018    Procedure: CARPAL TUNNEL RELEASE;  Surgeon: Cindee Salt, MD;  Location: [facility];  Service: Orthopedics;  Laterality: Left;   CATARACT EXTRACTION Bilateral    L 2004, R 2006   CHOLECYSTECTOMY     cyst removed left breast     TOTAL HIP ARTHROPLASTY Left 05/2021   TOTAL HIP ARTHROPLASTY Right 08/2021   TRIGGER FINGER RELEASE Right 08/19/2018   Procedure: RELEASE RIGHT INDEX TRIGGER FINGER/A-1 PULLEY;  Surgeon: Cindee Salt, MD;  Location: [facility];  Service: Orthopedics;  Laterality: Right;   WISDOM TOOTH EXTRACTION      Family History  Problem Relation Age of Onset   Colon cancer Father 88       x2 60's 6   Diabetes Father    Heart attack Father 44   Stroke Father 82   Hyperlipidemia Mother    Osteoarthritis Mother    Melanoma Mother    Breast cancer Neg Hx    Esophageal cancer Neg Hx    Pancreatic cancer Neg Hx    Rectal cancer Neg Hx    Stomach cancer Neg Hx     Allergies  Allergen Reactions   Erythromycin Other (See Comments)    REACTION: Confusion   Sulfonamide Derivatives Other (See Comments)    Pt reported sisters x 4 have had reaction to SULFA, so pt's DR. in [place] said to avoid taking SULFA.   Tape Rash    Current Outpatient Medications on File Prior to Visit  Medication Sig Dispense Refill   azelastine (ASTELIN) 0.1 % nasal spray Place 1-2 sprays into both nostrils 2 (two) times daily as needed for rhinitis or allergies. Use in each nostril as directed     budesonide-formoterol (SYMBICORT) 160-4.5 MCG/ACT inhaler Inhale 2 puffs into the lungs 2 (two) times daily as needed. 10.2 g 5   cholecalciferol (VITAMIN D) 1000 units tablet Take 1,000 Units by mouth daily.     cyanocobalamin 100 MCG tablet Take 100 mcg by mouth daily.     estradiol (ESTRACE) 0.1 MG/GM vaginal cream Place 0.5 g vaginally 2 (two) times a week. Place 0.5g nightly for two weeks then twice a week after 42.5 g 11   fexofenadine (ALLEGRA ODT) 30 MG disintegrating  tablet Take 30 mg by mouth daily.     multivitamin (THERAGRAN) per tablet Take 1 tablet by mouth daily.     polyethylene glycol (MIRALAX / GLYCOLAX) packet Take 17 g by mouth daily as needed.     No current facility-administered medications on file prior to visit.    BP (!) 140/62   Pulse 70   Temp 98.7 F (37.1 C)   Resp 18   Ht 5\' 5"  (1.651 m)   Wt 205 lb (93 kg)   SpO2 93%   BMI 34.11 kg/m        Objective:   Physical  Exam  General Mental Status- Alert. General Appearance- Not in acute distress.   Skin General: Color- Normal Color. Moisture- Normal Moisture.  Neck Carotid Arteries- Normal color. Moisture- Normal Moisture. No carotid bruits. No JVD.  Chest and Lung Exam Auscultation: Breath Sounds:-Normal.  Cardiovascular Auscultation:Rythm- Regular. Murmurs & Other Heart Sounds:Auscultation of the heart reveals- No Murmurs.  Abdomen Inspection:-Inspeection Normal. Palpation/Percussion:Note:No mass. Palpation and Percussion of the abdomen reveal- Non Tender, Non Distended + BS, no rebound or guarding.    Neurologic Cranial Nerve exam:- CN III-XII intact(No nystagmus), symmetric smile. Strength:- 5/5 equal and symmetric strength both upper and lower extremities.   Heent- maxillary sinus pressure. Rt ear canal clear. Tm mild red. Left side canal clear mild dull tm. Pharynx- nml but pnd.      Assessment & Plan:   Patient Instructions   Cough, unspecified type/ Bronchitis. Rx hycodan for cough. Rx advisement. Use symbicort if wheezing. If wheezing despite symbicort let us know. Get cxr to evaluate if pneumonia present. - DG Chest 2 View; Future  Nasal congestion Flonase nasal spray  Sinus pressure and Ear infection  Rx augmentin antibiotic.  Covid test negative.  Follow up in 7-10 days or sooner if needed   [employer], PA-C

## 2023-01-31 NOTE — Telephone Encounter (Signed)
Spoke with [facility] and they do

## 2023-01-31 NOTE — Telephone Encounter (Signed)
Pt said HYDROcodone bit-homatropine (HYCODAN) 5-1.5 MG/5ML syrup [[id number]]   is not available at pharmacy downstairs and she wants to have it sent to [facility] on [address].Marland Kitchen

## 2023-01-31 NOTE — Addendum Note (Signed)
Addended by: Gwenevere Abbot on: 01/31/2023 01:13 PM   Modules accepted: Orders

## 2023-01-31 NOTE — Telephone Encounter (Signed)
Pt called and lvm to notify her

## 2023-01-31 NOTE — Patient Instructions (Signed)
Cough, unspecified type/ Bronchitis. Rx hycodan for cough. Rx advisement. Use symbicort if wheezing. If wheezing despite symbicort let us know. Get cxr to evaluate if pneumonia present. - DG Chest 2 View; Future  Nasal congestion Flonase nasal spray  Sinus pressure and Ear infection  Rx augmentin antibiotic.  Covid test negative.  Follow up in 7-10 days or sooner if needed

## 2023-02-15 ENCOUNTER — Other Ambulatory Visit: Payer: Self-pay | Admitting: Medical

## 2023-02-15 ENCOUNTER — Encounter: Payer: Self-pay | Admitting: Medical

## 2023-02-15 MED ORDER — HYDROCODONE BIT-HOMATROP MBR 5-1.5 MG/5ML PO SOLN: 5.0000 mL | Freq: Three times a day (TID) | ORAL | 0 refills | Status: DC | PRN

## 2023-02-15 NOTE — Addendum Note (Signed)
Addended by: Gwenevere Abbot on: 02/15/2023 01:38 PM   Modules accepted: Orders

## 2023-02-19 NOTE — Progress Notes (Unsigned)
[facility] Urodynamics Procedure  Referring Physician: Wanda Plump, MD Date of Procedure: 02/20/2023  Paula Leblanc is a 62 y.o. female who presents for urodynamic evaluation. Indication(s) for study: Incomplete bladder emptying  Vital Signs: There were no vitals taken for this visit.  Laboratory Results: A {clean catch/ NWGN:56213} urine specimen revealed:  POC urine:    Voiding Diary: The patient voided {NUMBERS; 1-20:18551} times per day. Voided volumes ranged from *** mL to *** mL, with an average of *** mL.  Intervals between voids ranged from *** hr to ***hr with an average interval between voids of *** hr.  Intake per day ranged from ***mL to ***mL.  Output ranged from ***mL to ***mL.  She had *** incontinence episodes per day (*** UUI, *** SUI) and *** episodes of nocturia.  She drinks: *** oz of caffeinated beverages, *** oz of juice, and *** oz of alcohol per day.  Procedure Timeout:  The correct patient was verified and the correct procedure was verified. The patient was in the correct position and safety precautions were reviewed based on at the patient's history.  Urodynamic Procedure A 842F dual lumen urodynamics catheter was placed under sterile conditions into the patient's bladder. A 842F catheter was placed into the {vagina/ rectum:24786} in order to measure abdominal pressure. EMG patches were placed in the appropriate position.  All connections were confirmed and calibrations/adjusted made. Saline was instilled into the bladder through the dual lumen catheters.  Cough/valsalva pressures were measured periodically during filling.  Patient was allowed to void.  The bladder was then emptied of its residual.  UROFLOW: Revealed a Qmax of *** mL/sec.  She voided *** mL and had a residual of *** mL.  It was a {uroflow:24789} pattern and represented normal habits ***though interpretation limited due to low voided volume.***  CMG: This was performed with sterile  water in the sitting position at a fill rate of 30*** mL/min.    First sensation of fullness was *** mLs,  First urge was *** mLs,  Strong urge was *** mLs and  Capacity was *** mLs  Stress incontinence {WAS/WAS NOT:[id number]::"was not"} demonstrated {highest/ lowest:24787} {Desc; negative/positive:13464} CLPP was *** cmH20 at *** ml. {highest/ lowest:24787} {Desc; negative/positive:13464} VLPP was *** cmH20at *** ml. {highest/ lowest:24787} {Desc; negative/positive:13464} Barrier CLPP was *** cmH20 at *** ml. {highest/ lowest:24787} {Desc; negative/positive:13464} Barrier VLPP was *** cmH20 at *** ml.  Detrusor function was {Desc; normal/underactive/overactive:13463}, {With/no:32556} phasic contractions seen.  The first occurred at *** mL to *** cm of water and {WAS/WAS NOT:[id number]::"was not"} associated with {urge/ YQMV:78469}.  Compliance:  ***. End fill detrusor pressure was ***cmH20.  Calculated compliance was ***GE/XBM84  UPP: MUCP {With/without:5700} barrier reduction was *** cm of water.    MICTURITION STUDY: Voiding was performed {reduction:24791} in the sitting position.  Pdet at Qmax was *** cm of water.  Qmax was *** mL/sec.  It was a {uroflow:24789} pattern.  She voided *** mL and had a residual of *** mL.  It was a volitional void, sustained detrusor contraction was {DESC; PRESENT/NOT PRESENT:21021351} and abdominal straining was {DESC; PRESENT/NOT PRESENT:21021351}  EMG: This was performed with patches.  She had voluntary contractions, recruitment with fill was {DESC; PRESENT/NOT PRESENT:21021351} and urethral sphincter was {relaxed:24792} with void.  The details of the procedure with the study tracings have been scanned into EPIC.   Urodynamic Impression:  1. Sensation was {DESC; NORMAL/REDUCED/ABSENT/INCREASED:23133}; capacity was {DESC; NORMAL/REDUCED/ABSENT/INCREASED:23133} 2. Stress Incontinence {WAS/WAS NOT:[id number]::"was not"} demonstrated at {ISD:24793}  pressures;  3. Detrusor Overactivity {WAS/WAS NOT:[phone]::"was not"} demonstrated {With-without:32421} leakage. 4. Emptying was {dysfunctional:24794} with a {elevated:24795} PVR, a sustained detrusor contraction {DESC; PRESENT/NOT PRESENT:21021351},  abdominal straining {DESC; PRESENT/NOT PRESENT:21021351}, {dyssynergic:24796} urethral sphincter activity on EMG.  Plan: - The patient will follow up  to discuss the findings and treatment options.

## 2023-02-20 ENCOUNTER — Other Ambulatory Visit (HOSPITAL_COMMUNITY)
Admission: RE | Admit: 2023-02-20 | Discharge: 2023-02-20 | Disposition: A | Discharge status: Home / Self Care | Payer: 59 | Source: Other Acute Inpatient Hospital | Attending: Obstetrics and Gynecology | Admitting: Obstetrics and Gynecology

## 2023-02-20 ENCOUNTER — Encounter: Payer: Self-pay | Admitting: Obstetrics and Gynecology

## 2023-02-20 ENCOUNTER — Ambulatory Visit (INDEPENDENT_AMBULATORY_CARE_PROVIDER_SITE_OTHER): Payer: 59 | Admitting: Obstetrics and Gynecology

## 2023-02-20 VITALS — BP 107/69 | HR 64

## 2023-02-20 DIAGNOSIS — N8110 Cystocele, unspecified: Secondary | ICD-10-CM | POA: Diagnosis not present

## 2023-02-20 DIAGNOSIS — R350 Frequency of micturition: Secondary | ICD-10-CM | POA: Diagnosis present

## 2023-02-20 DIAGNOSIS — R339 Retention of urine, unspecified: Secondary | ICD-10-CM | POA: Diagnosis not present

## 2023-02-20 LAB — POCT URINALYSIS DIPSTICK
Bilirubin, UA: NEGATIVE
Glucose, UA: NEGATIVE
Ketones, UA: NEGATIVE
Nitrite, UA: NEGATIVE
Protein, UA: NEGATIVE
Spec Grav, UA: 1.015 (ref 1.010–1.025)
Urobilinogen, UA: 0.2 E.U./dL
pH, UA: 6.5 (ref 5.0–8.0)

## 2023-02-20 MED ORDER — PHENAZOPYRIDINE HCL 95 MG PO TABS: 95.0000 mg | ORAL_TABLET | Freq: Three times a day (TID) | ORAL | 1 refills | Status: DC | PRN

## 2023-02-20 NOTE — Patient Instructions (Signed)
Taking Care of Yourself after Urodynamics, Cystoscopy, Bulkamid Injection, or Botox Injection   Drink plenty of water for a day or two following your procedure. Try to have about 8 ounces (one cup) at a time, and do this 6 times or more per day unless you have fluid restrictitons AVOID irritative beverages such as coffee, tea, soda, alcoholic or citrus drinks for a day or two, as this may cause burning with urination. You may experience some discomfort or a burning sensation with urination after having this procedure. You can use over the counter Azo or pyridium to help with burning and follow the instructions on the packaging. If it does not improve within 1-2 days, or other symptoms appear (fever, chills, or difficulty urinating) call the office to speak to a nurse.  You may return to normal daily activities such as work, school, driving, exercising and housework on the day of the procedure. If your doctor gave you a prescription, take it as ordered.     

## 2023-02-21 LAB — URINE CULTURE: Culture: NO GROWTH

## 2023-02-21 NOTE — Addendum Note (Signed)
Addended by: Selmer Dominion on: 02/21/2023 10:29 AM   Modules accepted: Level of Service

## 2023-02-21 NOTE — Progress Notes (Addendum)
[facility] Return Visit  SUBJECTIVE  History of Present Illness: Paula Leblanc is a 62 y.o. female seen in follow-up for incomplete bladder emptying. Plan at last visit was utilize pessary and see if this is helpful in emptying bladder.  Patient feels the bladder has been somewhat helped by the use of the pessary. She has been changing the pessary at least once a week and using vaginal estrogen.   Underwent urodynamics today.      Past Medical History: Patient  has a past medical history of Allergy, Carpal tunnel syndrome, bilateral, Cataract, adenomatous polyp of colon (09/26/2016), Intrinsic asthma (04/15/2015), Osteoarthritis of both hands, PONV (postoperative nausea and vomiting), and Seasonal allergies.   Past Surgical History: She  has a past surgical history that includes Cholecystectomy; Cataract extraction (Bilateral); cyst removed left breast; Wisdom tooth extraction; Breast excisional biopsy (Left); Carpal tunnel release (Right, 08/19/2018); Trigger finger release (Right, 08/19/2018); Carpal tunnel release (Left, 12/09/2018); Total hip arthroplasty (Left, 05/2021); and Total hip arthroplasty (Right, 08/2021).   Medications: She has a current medication list which includes the following prescription(s): azelastine, budesonide-formoterol, cholecalciferol, cyanocobalamin, estradiol, fexofenadine, hydrocodone bit-homatropine, multivitamin, phenazopyridine, and polyethylene glycol.   Allergies: Patient is allergic to erythromycin, sulfonamide derivatives, and tape.   Social History: Patient  reports that she has never smoked. She has never used smokeless tobacco. She reports that she does not drink alcohol and does not use drugs.      OBJECTIVE   Urodynamic Impression:  1. Sensation was normal; capacity was normal 2. Stress Incontinence was demonstrated at normal pressures; 3. Detrusor Overactivity was not demonstrated without leakage. 4. Emptying was normal  with a normal PVR (50), a sustained detrusor contraction present,  abdominal straining not present, normal urethral sphincter activity on EMG.  Physical Exam: Vitals:   02/20/23 1350  BP: 107/69  Pulse: 64   Gen: No apparent distress, A&O x 3.  Detailed Urogynecologic Evaluation:  Deferred.    ASSESSMENT AND PLAN    Paula Leblanc is a 62 y.o. with:  1. Incomplete bladder emptying   2. Urinary frequency   3. Prolapse of anterior vaginal wall    Both her initial void PVR and her micturition study PVR today were less than 200. She reports she feels the pessary is helping her empty her bladder well. AUA guidelines at this time say that this can be watched. If she develops problems with more retention that is greater than in the bladder then will pursue Renal US and have patient to in and out cathing to establish data pattern of retention.  Reports she feels she is having less frequency and higher volume voids with pessary in place. She will plan to continue use and remove at least once a week and for intercourse.  Pessary in use. Otherwise will expectantly manage.   Patient to follow up in one year or sooner if needed.

## 2023-03-05 ENCOUNTER — Ambulatory Visit: Payer: 59 | Admitting: Obstetrics and Gynecology

## 2023-05-01 ENCOUNTER — Encounter (INDEPENDENT_AMBULATORY_CARE_PROVIDER_SITE_OTHER): Payer: Self-pay

## 2023-05-17 ENCOUNTER — Ambulatory Visit: Payer: 59 | Admitting: Internal Medicine

## 2023-05-23 ENCOUNTER — Ambulatory Visit (INDEPENDENT_AMBULATORY_CARE_PROVIDER_SITE_OTHER): Payer: 59 | Admitting: Family Medicine

## 2023-05-23 ENCOUNTER — Encounter: Payer: Self-pay | Admitting: Family Medicine

## 2023-05-23 VITALS — BP 131/48 | HR 60 | Temp 98.6°F | Resp 16 | Ht 65.0 in | Wt 209.0 lb

## 2023-05-23 DIAGNOSIS — R051 Acute cough: Secondary | ICD-10-CM

## 2023-05-23 DIAGNOSIS — J0140 Acute pansinusitis, unspecified: Secondary | ICD-10-CM | POA: Diagnosis not present

## 2023-05-23 DIAGNOSIS — H6690 Otitis media, unspecified, unspecified ear: Secondary | ICD-10-CM

## 2023-05-23 DIAGNOSIS — J4520 Mild intermittent asthma, uncomplicated: Secondary | ICD-10-CM | POA: Diagnosis not present

## 2023-05-23 MED ORDER — AMOXICILLIN-POT CLAVULANATE 875-125 MG PO TABS
1.0000 | ORAL_TABLET | Freq: Two times a day (BID) | ORAL | 0 refills | Status: DC
Start: 2023-05-23 — End: 2023-10-17

## 2023-05-23 MED ORDER — ALBUTEROL SULFATE HFA 108 (90 BASE) MCG/ACT IN AERS
2.0000 | INHALATION_SPRAY | Freq: Four times a day (QID) | RESPIRATORY_TRACT | 0 refills | Status: AC | PRN
Start: 2023-05-23 — End: ?

## 2023-05-23 MED ORDER — HYDROCODONE BIT-HOMATROP MBR 5-1.5 MG/5ML PO SOLN
5.0000 mL | Freq: Three times a day (TID) | ORAL | 0 refills | Status: DC | PRN
Start: 2023-05-23 — End: 2023-05-27

## 2023-05-23 NOTE — Progress Notes (Signed)
Acute Office Visit  Subjective:     Patient ID: Paula Leblanc, female    DOB: [birth year], 62 y.o.   MRN: [id number]  Chief Complaint  Patient presents with   Cough     Patient is in today for URI.   Discussed the use of AI scribe software for clinical note transcription with the patient, who gave verbal consent to proceed.  History of Present Illness   The patient, with a history of allergies and asthma, presents with a week-long history of upper respiratory symptoms. They report this is a recurrent issue, occurring three to four times a year. The symptoms began with a runny nose, which they attribute to their allergies. Over the past two days, they have experienced worsening ear discomfort, suggesting a possible sinus infection. They report occasional production of 'yucky stuff' when blowing their nose. They also describe congestion, pressure, headaches, and ear popping. They have a sore throat and significant postnasal drainage.  The patient also reports a cough, which has been worsening, and weakness. They had a fever ranging from 100 to 102 degrees Fahrenheit earlier in the week, which has since decreased to the 99-degree range. They describe feeling hot and cold intermittently. They deny any nausea, vomiting, diarrhea, or chest pain.  The patient has been taking over-the-counter congestion medication from [facility], which has been somewhat helpful for the cough. They have also taken aspirin as needed for aches. They report struggling with hydration. They have not taken a COVID test since the onset of these symptoms. They have been using their Symbicort inhaler for their asthma, which they report has been flaring up slightly. They have not been using a rescue inhaler.           All review of systems negative except what is listed in the HPI      Objective:    BP (!) 131/48   Pulse 60   Temp 98.6 F (37 C) (Oral)   Resp 16   Ht 5\' 5"  (1.651 m)   Wt 209 lb (94.8 kg)   SpO2  98%   BMI 34.78 kg/m    Physical Exam Vitals reviewed.  Constitutional:      General: She is not in acute distress.    Appearance: Normal appearance. She is obese. She is not ill-appearing.  HENT:     Head: Normocephalic and atraumatic.     Right Ear: Tympanic membrane normal.     Left Ear: Tympanic membrane is erythematous and bulging.     Nose: Rhinorrhea present.     Mouth/Throat:     Mouth: Mucous membranes are moist.     Pharynx: Oropharynx is clear.  Eyes:     Conjunctiva/sclera: Conjunctivae normal.  Cardiovascular:     Rate and Rhythm: Normal rate and regular rhythm.     Heart sounds: Normal heart sounds.  Pulmonary:     Effort: Pulmonary effort is normal.     Breath sounds: Normal breath sounds. No wheezing, rhonchi or rales.  Musculoskeletal:     Cervical back: Normal range of motion.  Skin:    General: Skin is warm and dry.  Neurological:     Mental Status: She is alert and oriented to person, place, and time.  Psychiatric:        Mood and Affect: Mood normal.        Behavior: Behavior normal.        Thought Content: Thought content normal.        Judgment:  Judgment normal.     No results found for any visits on 05/23/23.      Assessment & Plan:   Problem List Items Addressed This Visit   None Visit Diagnoses     Acute non-recurrent pansinusitis    -  Primary   Relevant Medications   HYDROcodone bit-homatropine (HYCODAN) 5-1.5 MG/5ML syrup   amoxicillin-clavulanate (AUGMENTIN) 875-125 MG tablet   Acute otitis media, unspecified otitis media type       Relevant Medications   amoxicillin-clavulanate (AUGMENTIN) 875-125 MG tablet   Mild intermittent asthma without complication       Relevant Medications   albuterol (VENTOLIN HFA) 108 (90 Base) MCG/ACT inhaler   Acute cough       Relevant Medications   HYDROcodone bit-homatropine (HYCODAN) 5-1.5 MG/5ML syrup      URI with cough and sinus pressure >8 days, now with left AOM.  Start  Augmentin Adding Hycodan for cough Adding PRN albuterol for wheezing/asthma - lungs stable on exam today Continue supportive measures including rest, hydration, humidifier use, steam showers, warm compresses to sinuses, warm liquids with lemon and honey, and over-the-counter cough, cold, and analgesics as needed.  Could consider prednisone burst if not making significant improvement.  Please contact office for follow-up if symptoms do not improve or worsen. Seek emergency care if symptoms become severe.     Meds ordered this encounter  Medications   HYDROcodone bit-homatropine (HYCODAN) 5-1.5 MG/5ML syrup    Sig: Take 5 mLs by mouth every 8 (eight) hours as needed for cough.    Dispense:  120 mL    Refill:  0    Order Specific Question:   Supervising Provider    Answer:   Danise Edge A [[id number]]   amoxicillin-clavulanate (AUGMENTIN) 875-125 MG tablet    Sig: Take 1 tablet by mouth 2 (two) times daily.    Dispense:  20 tablet    Refill:  0    Order Specific Question:   Supervising Provider    Answer:   Danise Edge A [[id number]]   albuterol (VENTOLIN HFA) 108 (90 Base) MCG/ACT inhaler    Sig: Inhale 2 puffs into the lungs every 6 (six) hours as needed for wheezing or shortness of breath.    Dispense:  8 g    Refill:  0    Order Specific Question:   Supervising Provider    Answer:   Danise Edge A [[id number]]    Return if symptoms worsen or fail to improve.  Clayborne Dana, NP

## 2023-05-23 NOTE — Patient Instructions (Signed)
The following information is provided as a general resource for ADULT patients only and does NOT take into account PREGNANCY, ALLERGIES, LIVER CONDITIONS, KIDNEY CONDITIONS, GASTROINTESTINAL CONDITIONS, OR PRESCRIPTION MEDICATION INTERACTIONS. Please be sure to ask your provider if the following are safe to take with your specific medical history, conditions, or current medication regimen if you are unsure.   Adult Basic Symptom Management for Sinusitis  Congestion: Guaifenesin (Mucinex)- follow directions on packaging with a maximum dose of 2400mg in a 24 hour period.  Pain/Fever: Ibuprofen 200mg - 400mg every 4-6 hours as needed (MAX 1200mg in a 24 hour period) Pain/Fever: Tylenol 500mg -1000mg every 6-8 hours as needed (MAX 3000mg in a 24 hour period)  Cough: Dextromethorphan (Delsym)- follow directions on packing with a maximum dose of 120mg in a 24 hour period.  Nasal Stuffiness: Saline nasal spray and/or Nettie Pot with sterile saline solution  Runny Nose: Fluticasone nasal spray (Flonase) OR Mometasone nasal spray (Nasonex) OR Triamcinolone Acetonide nasal spray (Nasacort)- follow directions on the packaging  Pain/Pressure: Warm washcloth to the face  Sore Throat: Warm salt water gargles  If you have allergies, you may also consider taking an oral antihistamine (like Zyrtec or Claritin) as these may also help with your symptoms.  **Many medications will have more than one ingredient, be sure you are reading the packaging carefully and not taking more than one dose of the same kind of medication at the same time or too close together. It is OK to use formulas that have all of the ingredients you want, but do not take them in a combined medication and as separate dose too close together. If you have any questions, the pharmacist will be happy to help you decide what is safe.    

## 2023-05-26 ENCOUNTER — Encounter: Payer: Self-pay | Admitting: Family Medicine

## 2023-05-26 DIAGNOSIS — R051 Acute cough: Secondary | ICD-10-CM

## 2023-05-27 MED ORDER — HYDROCODONE BIT-HOMATROP MBR 5-1.5 MG/5ML PO SOLN
5.0000 mL | Freq: Three times a day (TID) | ORAL | 0 refills | Status: DC | PRN
Start: 2023-05-27 — End: 2023-10-17

## 2023-05-27 NOTE — Telephone Encounter (Signed)
Pended.

## 2023-08-01 ENCOUNTER — Encounter: Payer: Self-pay | Admitting: Internal Medicine

## 2023-08-07 ENCOUNTER — Encounter: Payer: Self-pay | Admitting: Internal Medicine

## 2023-10-08 ENCOUNTER — Ambulatory Visit (INDEPENDENT_AMBULATORY_CARE_PROVIDER_SITE_OTHER): Payer: 59

## 2023-10-08 DIAGNOSIS — Z23 Encounter for immunization: Secondary | ICD-10-CM

## 2023-10-08 NOTE — Progress Notes (Signed)
 Marland Kitchen

## 2023-10-16 ENCOUNTER — Telehealth: Payer: 59

## 2023-10-16 DIAGNOSIS — J45901 Unspecified asthma with (acute) exacerbation: Secondary | ICD-10-CM | POA: Diagnosis not present

## 2023-10-16 DIAGNOSIS — J069 Acute upper respiratory infection, unspecified: Secondary | ICD-10-CM | POA: Diagnosis not present

## 2023-10-17 MED ORDER — PREDNISONE 20 MG PO TABS: 40.0000 mg | ORAL_TABLET | Freq: Every day | ORAL | 0 refills | Status: DC

## 2023-10-17 MED ORDER — DOXYCYCLINE HYCLATE 100 MG PO TABS: 100.0000 mg | ORAL_TABLET | Freq: Two times a day (BID) | ORAL | 0 refills | Status: DC

## 2023-10-17 MED ORDER — PROMETHAZINE-DM 6.25-15 MG/5ML PO SYRP: 5.0000 mL | ORAL_SOLUTION | Freq: Four times a day (QID) | ORAL | 0 refills | Status: DC | PRN

## 2023-10-17 NOTE — Progress Notes (Signed)
E-Visit for Cough   We are sorry that you are not feeling well.  Here is how we plan to help!  Based on your presentation I believe you most likely have A cough due to bacteria.  When patients have a fever and a productive cough with a change in color or increased sputum production, we are concerned about bacterial bronchitis.  If left untreated it can progress to pneumonia.  If your symptoms do not improve with your treatment plan it is important that you contact your provider.   I have prescribed Doxycycline 100 mg twice a day for 7 days     In addition I have sent in a prescription cough syrup (promethazine-dm) to use as directed. We cannot prescribe controlled medications (codeine containing medications included) via e-visit. I have sent in a short burst of prednisone to relax your airways.  From your responses in the eVisit questionnaire you describe inflammation in the upper respiratory tract which is causing a significant cough.  This is commonly called Bronchitis and has four common causes:   Allergies Viral Infections Acid Reflux Bacterial Infection Allergies, viruses and acid reflux are treated by controlling symptoms or eliminating the cause. An example might be a cough caused by taking certain blood pressure medications. You stop the cough by changing the medication. Another example might be a cough caused by acid reflux. Controlling the reflux helps control the cough.  USE OF BRONCHODILATOR ("RESCUE") INHALERS: There is a risk from using your bronchodilator too frequently.  The risk is that over-reliance on a medication which only relaxes the muscles surrounding the breathing tubes can reduce the effectiveness of medications prescribed to reduce swelling and congestion of the tubes themselves.  Although you feel brief relief from the bronchodilator inhaler, your asthma may actually be worsening with the tubes becoming more swollen and filled with mucus.  This can delay other crucial  treatments, such as oral steroid medications. If you need to use a bronchodilator inhaler daily, several times per day, you should discuss this with your provider.  There are probably better treatments that could be used to keep your asthma under control.     HOME CARE Only take medications as instructed by your medical team. Complete the entire course of an antibiotic. Drink plenty of fluids and get plenty of rest. Avoid close contacts especially the very young and the elderly Cover your mouth if you cough or cough into your sleeve. Always remember to wash your hands A steam or ultrasonic humidifier can help congestion.   GET HELP RIGHT AWAY IF: You develop worsening fever. You become short of breath You cough up blood. Your symptoms persist after you have completed your treatment plan MAKE SURE YOU  Understand these instructions. Will watch your condition. Will get help right away if you are not doing well or get worse.    Thank you for choosing an e-visit.  Your e-visit answers were reviewed by a board certified advanced clinical practitioner to complete your personal care plan. Depending upon the condition, your plan could have included both over the counter or prescription medications.  Please review your pharmacy choice. Make sure the pharmacy is open so you can pick up prescription now. If there is a problem, you may contact your provider through [employer] and have the prescription routed to another pharmacy.  Your safety is important to [place]. If you have drug allergies check your prescription carefully.   For the next 24 hours you can use MyChart to  ask questions about today's visit, request a non-urgent call back, or ask for a work or school excuse. You will get an email in the next two days asking about your experience. I hope that your e-visit has been valuable and will speed your recovery.

## 2023-10-17 NOTE — Progress Notes (Signed)
I have spent 5 minutes in review of e-visit questionnaire, review and updating patient chart, medical decision making and response to patient.   Mia Milan Cody Jacklynn Dehaas, PA-C    

## 2023-12-29 ENCOUNTER — Encounter: Payer: Self-pay | Admitting: Internal Medicine

## 2023-12-30 ENCOUNTER — Telehealth: Payer: Self-pay | Admitting: Neurology

## 2023-12-30 NOTE — Telephone Encounter (Signed)
 Copied from CRM [id number]. Topic: Appointments - Scheduling Inquiry for Clinic >> Dec 30, 2023  4:21 PM Tiffany S wrote: Reason for CRM: Patient has an appt on 01/08/24 patient wants to get Pneumococcal (Pneumovax) injection on that same day but no Nurse visits were available. Patient wants to know if she can worked in while she is there please follow up with patient

## 2023-12-31 NOTE — Telephone Encounter (Signed)
 Pt is actually overdue to see Dr. Drue Novel. Annice Pih- will you schedule her a visit at her convenience, we can discuss vaccines at that time. Thank you

## 2024-01-01 NOTE — Telephone Encounter (Signed)
 Pt scheduled 6 month fu appt on 04-15. Done

## 2024-01-07 DIAGNOSIS — M25562 Pain in left knee: Secondary | ICD-10-CM | POA: Diagnosis not present

## 2024-01-07 DIAGNOSIS — M1711 Unilateral primary osteoarthritis, right knee: Secondary | ICD-10-CM | POA: Diagnosis not present

## 2024-01-14 ENCOUNTER — Ambulatory Visit: Admitting: Internal Medicine

## 2024-01-28 ENCOUNTER — Ambulatory Visit (INDEPENDENT_AMBULATORY_CARE_PROVIDER_SITE_OTHER): Admitting: Internal Medicine

## 2024-01-28 ENCOUNTER — Encounter: Payer: Self-pay | Admitting: Internal Medicine

## 2024-01-28 VITALS — BP 122/86 | HR 76 | Temp 98.2°F | Resp 16 | Ht 65.0 in | Wt 227.1 lb

## 2024-01-28 DIAGNOSIS — Z636 Dependent relative needing care at home: Secondary | ICD-10-CM

## 2024-01-28 DIAGNOSIS — Z23 Encounter for immunization: Secondary | ICD-10-CM

## 2024-01-28 DIAGNOSIS — Z1211 Encounter for screening for malignant neoplasm of colon: Secondary | ICD-10-CM

## 2024-01-28 DIAGNOSIS — Z0000 Encounter for general adult medical examination without abnormal findings: Secondary | ICD-10-CM

## 2024-01-28 DIAGNOSIS — E870 Hyperosmolality and hypernatremia: Secondary | ICD-10-CM | POA: Diagnosis not present

## 2024-01-28 DIAGNOSIS — E119 Type 2 diabetes mellitus without complications: Secondary | ICD-10-CM | POA: Diagnosis not present

## 2024-01-28 DIAGNOSIS — R5383 Other fatigue: Secondary | ICD-10-CM

## 2024-01-28 DIAGNOSIS — Z0001 Encounter for general adult medical examination with abnormal findings: Secondary | ICD-10-CM | POA: Diagnosis not present

## 2024-01-28 DIAGNOSIS — R739 Hyperglycemia, unspecified: Secondary | ICD-10-CM | POA: Diagnosis not present

## 2024-01-28 DIAGNOSIS — Z1231 Encounter for screening mammogram for malignant neoplasm of breast: Secondary | ICD-10-CM

## 2024-01-28 NOTE — Patient Instructions (Addendum)
 Call gastroenterology to set up your next colonoscopy. [phone].  Vaccines I recommend: Covid booster  Shingrix (shingles) Flu shot every fall   Preventing Caregiver Burnout  This video will help you recognize caregiver burnout and describe ways to manage the stress that can come with caring for someone with a chronic illness. To view the content, go to this web address: [email or web address]  This video will expire on: 09/11/2025. If you need access to this video following this date, please reach out to the healthcare provider who assigned it to you. This information is not intended to replace advice given to you by your health care provider. Make sure you discuss any questions you have with your health care provider. Elsevier Patient Education  2024 [employer]RSV vaccine     GO TO THE LAB : Get the blood work     Next office visit for a a checkup in 4 months Please make an appointment before you leave today

## 2024-01-28 NOTE — Progress Notes (Unsigned)
 Subjective:    Patient ID: Paula Leblanc, female    DOB: 10/10/1960, 63 y.o.   MRN: [id number]  DOS:  01/28/2024 Type of visit - description: CPX  Here for CPX. Physically feeling very well. She has been quite distressed due to her husband's health.   Review of Systems See above   Past Medical History:  Diagnosis Date   Allergy    Carpal tunnel syndrome, bilateral    Cataract    bil cataracts removed   Hx of adenomatous polyp of colon 09/26/2016   Intrinsic asthma 04/15/2015   Osteoarthritis of both hands    PONV (postoperative nausea and vomiting)    Seasonal allergies     Past Surgical History:  Procedure Laterality Date   BREAST EXCISIONAL BIOPSY Left    CARPAL TUNNEL RELEASE Right 08/19/2018   Procedure: RIGHT CARPAL TUNNEL RELEASE;  Surgeon: Lyanne Sample, MD;  Location: [facility];  Service: Orthopedics;  Laterality: Right;   CARPAL TUNNEL RELEASE Left 12/09/2018   Procedure: CARPAL TUNNEL RELEASE;  Surgeon: Lyanne Sample, MD;  Location: [facility];  Service: Orthopedics;  Laterality: Left;   CATARACT EXTRACTION Bilateral    L 2004, R 2006   CHOLECYSTECTOMY     cyst removed left breast     TOTAL HIP ARTHROPLASTY Left 05/2021   TOTAL HIP ARTHROPLASTY Right 08/2021   TRIGGER FINGER RELEASE Right 08/19/2018   Procedure: RELEASE RIGHT INDEX TRIGGER FINGER/A-1 PULLEY;  Surgeon: Lyanne Sample, MD;  Location: [facility];  Service: Orthopedics;  Laterality: Right;   WISDOM TOOTH EXTRACTION      Current Outpatient Medications  Medication Instructions   albuterol  (VENTOLIN  HFA) 108 (90 Base) MCG/ACT inhaler 2 puffs, Inhalation, Every 6 hours PRN   azelastine  (ASTELIN ) 0.1 % nasal spray 1-2 sprays, 2 times daily PRN   budesonide -formoterol  (SYMBICORT ) 160-4.5 MCG/ACT inhaler 2 puffs, Inhalation, 2 times daily PRN   cholecalciferol (VITAMIN D ) 1,000 Units, Daily   cyanocobalamin 100 mcg, Daily   doxycycline  (VIBRA -TABS) 100 mg,  Oral, 2 times daily   estradiol  (ESTRACE ) 0.5 g, Vaginal, 2 times weekly, Place 0.5g nightly for two weeks then twice a week after   fexofenadine (ALLEGRA ODT) 30 mg, Daily   multivitamin (THERAGRAN) per tablet 1 tablet, Daily   polyethylene glycol (MIRALAX / GLYCOLAX) 17 g, Daily PRN   predniSONE  (DELTASONE ) 40 mg, Oral, Daily with breakfast   promethazine -dextromethorphan (PROMETHAZINE -DM) 6.25-15 MG/5ML syrup 5 mLs, Oral, 4 times daily PRN       Objective:   Physical Exam BP 122/86   Pulse 76   Temp 98.2 F (36.8 C) (Oral)   Resp 16   Ht 5\' 5"  (1.651 m)   Wt 227 lb 2 oz (103 kg)   SpO2 97%   BMI 37.80 kg/m  General: Well developed, NAD, BMI noted Neck: No  thyromegaly  HEENT:  Normocephalic . Face symmetric, atraumatic Lungs:  CTA B Normal respiratory effort, no intercostal retractions, no accessory muscle use. Heart: RRR,  no murmur.  Abdomen:  Not distended, soft, non-tender. No rebound or rigidity.   Lower extremities: no pretibial edema bilaterally  Skin: Exposed areas without rash. Not pale. Not jaundice Neurologic:  alert & oriented X3.  Speech normal, gait appropriate for age and unassisted Strength symmetric and appropriate for age.  Psych: Cognition and judgment appear intact.  Cooperative with normal attention span and concentration.  Behavior appropriate. No anxious or depressed appearing.     Assessment  Assessment  Hyperglycemia Asthma MSK -CTS B -DJD  -hands, saw ortho before, had XRs -spine stenosis, Dx per Dr Rexanne Catalina via MRI 11-2016 ( Mild spinal stenosis L4-5. Grade 1 anterior slip with severe facet degeneratio) Menopause -- onset ~ 63 y/o FH CAD F age 49 Coronary calcium score 11-2022: 0   PLAN Here for CPX -Td 07-2016 - pnm shot 2017, prevnar:07-2015, PNM 20: Today - Vaccines I recommend: Flu shot every fall,  Shingrix, COVID booster (from 06-2023). RSV is a consideration -Female care :     MMG: 11/2022, rec a MMG this  year PAP:12/2022  DEXA: WNL 11/2022. -CCS:  cscope 11-2010 , tics; cscope 08-2016, 1 polyp, next 09-2023 per GI letter , rec to call GI   -Labs:   CMP FLP CBC A1c -Diet exercise: Counseled Other issues addressed Hyperglycemia: History of, recently checked ambulatory CBGs in the morning in the 140s.  Check A1c.  Further eval with results Asthma: Currently well-controlled Stress: Related to the help of her husband, she expressed caregiver fatigue.  Listening therapy provided, will refer to social worker, patient interested on a support group. RTC 4 months mostly to check on caregiver fatigue  The 10-year ASCVD risk score (Arnett DK, et al., 2019) is: 5.9%   Values used to calculate the score:     Age: 46 years     Sex: Female     Is Non-Hispanic African American: No     Diabetic: No     Tobacco smoker: No     Systolic Blood Pressure: 122 mmHg     Is BP treated: No     HDL Cholesterol: 41.9 mg/dL     Total Cholesterol: 216 mg/dL    Hyperglycemia: Checking A1c Asthma: Well-controlled LUTS: As described above, send UA urine culture, she wonders about seen a urologist, rec to see gynecology first. FH CAD: Asymptomatic, checking a cholesterol level. Consider a  coronary calcium score. RTC 6 months

## 2024-01-29 ENCOUNTER — Encounter: Payer: Self-pay | Admitting: Internal Medicine

## 2024-01-29 LAB — COMPREHENSIVE METABOLIC PANEL WITH GFR
ALT: 31 U/L (ref 0–35)
AST: 22 U/L (ref 0–37)
Albumin: 4.1 g/dL (ref 3.5–5.2)
Alkaline Phosphatase: 68 U/L (ref 39–117)
BUN: 15 mg/dL (ref 6–23)
CO2: 31 meq/L (ref 19–32)
Calcium: 9.3 mg/dL (ref 8.4–10.5)
Chloride: 109 meq/L (ref 96–112)
Creatinine, Ser: 0.76 mg/dL (ref 0.40–1.20)
GFR: 82.37 mL/min (ref >60.00)
Glucose, Bld: 97 mg/dL (ref 70–99)
Potassium: 4.5 meq/L (ref 3.5–5.1)
Sodium: 148 meq/L — ABNORMAL HIGH (ref 135–145)
Total Bilirubin: 0.3 mg/dL (ref 0.2–1.2)
Total Protein: 6.5 g/dL (ref 6.0–8.3)

## 2024-01-29 LAB — CBC WITH DIFFERENTIAL/PLATELET
Basophils Absolute: 0.1 10*3/uL (ref 0.0–0.1)
Basophils Relative: 1.4 % (ref 0.0–3.0)
Eosinophils Absolute: 0.2 10*3/uL (ref 0.0–0.7)
Eosinophils Relative: 3.1 % (ref 0.0–5.0)
HCT: 44.3 % (ref 36.0–46.0)
Hemoglobin: 14.4 g/dL (ref 12.0–15.0)
Lymphocytes Relative: 23.9 % (ref 12.0–46.0)
Lymphs Abs: 1.9 10*3/uL (ref 0.7–4.0)
MCHC: 32.5 g/dL (ref 30.0–36.0)
MCV: 92.5 fl (ref 78.0–100.0)
Monocytes Absolute: 0.6 10*3/uL (ref 0.1–1.0)
Monocytes Relative: 7.1 % (ref 3.0–12.0)
Neutro Abs: 5 10*3/uL (ref 1.4–7.7)
Neutrophils Relative %: 64.5 % (ref 43.0–77.0)
Platelets: 241 10*3/uL (ref 150.0–400.0)
RBC: 4.79 Mil/uL (ref 3.87–5.11)
RDW: 13.8 % (ref 11.5–15.5)
WBC: 7.8 10*3/uL (ref 4.0–10.5)

## 2024-01-29 LAB — LIPID PANEL
Cholesterol: 221 mg/dL — ABNORMAL HIGH (ref 0–200)
HDL: 38.7 mg/dL — ABNORMAL LOW (ref >39.00)
LDL Cholesterol: 124 mg/dL — ABNORMAL HIGH (ref 0–99)
NonHDL: 182
Total CHOL/HDL Ratio: 6
Triglycerides: 289 mg/dL — ABNORMAL HIGH (ref 0.0–149.0)
VLDL: 57.8 mg/dL — ABNORMAL HIGH (ref 0.0–40.0)

## 2024-01-29 LAB — HEMOGLOBIN A1C: Hgb A1c MFr Bld: 6.6 % — ABNORMAL HIGH (ref 4.6–6.5)

## 2024-01-29 NOTE — Assessment & Plan Note (Signed)
 Here for CPX  Other issues addressed Hyperglycemia: History of, recently checked ambulatory CBGs in the morning in the 140s.  Check A1c.  Further eval with results Asthma: Currently well-controlled Stress: Related to the help of her husband, she expressed caregiver fatigue.  Listening therapy provided, will refer to social worker, as patient interested on a support group. RTC 4 months mostly to check on caregiver fatigue

## 2024-01-29 NOTE — Assessment & Plan Note (Signed)
 Here for CPX -Td 07-2016 - pnm shot 2017, prevnar:07-2015, PNM 20: Today - Vaccines I recommend: Flu shot every fall,  Shingrix, COVID booster (from 06-2023). RSV is a consideration -Female care :     MMG: 11/2022, rec a MMG this year PAP:12/2022  DEXA: WNL 11/2022. -CCS:  cscope 11-2010 , tics; cscope 08-2016, 1 polyp, next 09-2023 per GI letter , rec to call GI  -Labs:   CMP FLP CBC A1c -Diet exercise: Counseled

## 2024-01-30 ENCOUNTER — Telehealth: Payer: Self-pay

## 2024-01-30 NOTE — Progress Notes (Signed)
 Complex Care Management Note Care Guide Note  01/30/2024 Name: Paula Leblanc MRN: [id number] DOB: 03/20/1961   Complex Care Management Outreach Attempts: An unsuccessful outreach was attempted for an appointment today.  Follow Up Plan:  Additional outreach attempts will be made to offer the patient complex care management information and services.   Encounter Outcome:  No Answer  [facility]  [facility], [facility] Care Management Assistant Direct Dial: [phone]  Fax: [phone]

## 2024-01-31 NOTE — Progress Notes (Signed)
 Complex Care Management Note  Care Guide Note 01/31/2024 Name: PAULA GASSLER MRN: [id number] DOB: [birth year]  SARENA NYBERG is a 63 y.o. year old female who sees Neomi Banks, Anitra Ket, MD for primary care. I reached out to Alvaro Augusta by phone today to offer complex care management services.  Ms. Zeiss was given information about Complex Care Management services today including:   The Complex Care Management services include support from the care team which includes your Nurse Care Manager, Clinical Social Worker, or Pharmacist.  The Complex Care Management team is here to help remove barriers to the health concerns and goals most important to you. Complex Care Management services are voluntary, and the patient may decline or stop services at any time by request to their care team member.   Complex Care Management Consent Status: Patient agreed to services and verbal consent obtained.   Follow up plan:  Telephone appointment with complex care management team member scheduled for:  02/07/24 at 9:00 a.m.   Encounter Outcome:  Patient Scheduled  [facility]  [facility], [facility] Health Care Management Assistant Direct Dial: [phone]  Fax: [phone]

## 2024-02-01 ENCOUNTER — Encounter: Payer: Self-pay | Admitting: Internal Medicine

## 2024-02-03 NOTE — Addendum Note (Signed)
 Addended by: Ronella Plunk D on: 02/03/2024 07:57 AM   Modules accepted: Orders

## 2024-02-06 ENCOUNTER — Other Ambulatory Visit (INDEPENDENT_AMBULATORY_CARE_PROVIDER_SITE_OTHER)

## 2024-02-06 ENCOUNTER — Encounter: Payer: Self-pay | Admitting: Internal Medicine

## 2024-02-06 DIAGNOSIS — E870 Hyperosmolality and hypernatremia: Secondary | ICD-10-CM

## 2024-02-06 DIAGNOSIS — E119 Type 2 diabetes mellitus without complications: Secondary | ICD-10-CM

## 2024-02-06 NOTE — Progress Notes (Signed)
 Specimen obtained.

## 2024-02-07 ENCOUNTER — Other Ambulatory Visit: Payer: Self-pay | Admitting: Licensed Clinical Social Worker

## 2024-02-07 LAB — MICROALBUMIN / CREATININE URINE RATIO
Creatinine,U: 22.7 mg/dL
Microalb Creat Ratio: UNDETERMINED mg/g (ref 0.0–30.0)
Microalb, Ur: <0.7 mg/dL

## 2024-02-07 LAB — OSMOLALITY, URINE: Osmolality, Ur: 174 mosm/kg (ref 50–1200)

## 2024-02-07 NOTE — Patient Outreach (Signed)
 Complex Care Management   Visit Note  02/07/2024  Name:  Paula Leblanc MRN: [id number] DOB: [birth year]  Situation: Referral received for Complex Care Management related to anxiety symptoms I obtained verbal consent from Patient.  Visit completed with K Jeon  on the phone  Background:   Past Medical History:  Diagnosis Date   Allergy    Carpal tunnel syndrome, bilateral    Cataract    bil cataracts removed   Hx of adenomatous polyp of colon 09/26/2016   Intrinsic asthma 04/15/2015   Osteoarthritis of both hands    PONV (postoperative nausea and vomiting)    Seasonal allergies     Assessment: Patient Reported Symptoms:  Cognitive Cognitive Status: Able to follow simple commands, Alert and oriented to person, place, and time Cognitive/Intellectual Conditions Management [RPT]: Not Assessed      Neurological Neurological Review of Symptoms: No symptoms reported    HEENT HEENT Symptoms Reported: No symptoms reported      Cardiovascular      Respiratory Respiratory Symptoms Reported: No symptoms reported    Endocrine Patient reports the following symptoms related to hypoglycemia or hyperglycemia : No symptoms reported    Gastrointestinal Gastrointestinal Symptoms Reported: No symptoms reported      Genitourinary Genitourinary Symptoms Reported: No symptoms reported    Integumentary Integumentary Symptoms Reported: No symptoms reported    Musculoskeletal          Psychosocial       Do you feel physically threatened by others?: No      01/28/2024    3:19 PM  Depression screen PHQ 2/9  Decreased Interest 0  Down, Depressed, Hopeless 1  PHQ - 2 Score 1  Altered sleeping 0  Tired, decreased energy 1  Change in appetite 1  Feeling bad or failure about yourself  0  Trouble concentrating 0  Moving slowly or fidgety/restless 0  Suicidal thoughts 0  PHQ-9 Score 3    There were no vitals filed for this visit.  Medications Reviewed Today     Reviewed by  Fletcher Humble, LCSW (Social Worker) on 02/07/24 at [id number]  Med List Status: <None>   Medication Order Taking? Sig Documenting Provider Last Dose Status Informant  albuterol  (VENTOLIN  HFA) 108 (90 Base) MCG/ACT inhaler [id number] No Inhale 2 puffs into the lungs every 6 (six) hours as needed for wheezing or shortness of breath. Everlina Hock, NP Taking Active   azelastine  (ASTELIN ) 0.1 % nasal spray [id number] No Place 1-2 sprays into both nostrils 2 (two) times daily as needed for rhinitis or allergies. Use in each nostril as directed [provider] Taking Active   budesonide -formoterol  (SYMBICORT ) 160-4.5 MCG/ACT inhaler [id number] No Inhale 2 puffs into the lungs 2 (two) times daily as needed. Paz, Jose E, MD Taking Active   cholecalciferol (VITAMIN D ) 1000 units tablet [id number] No Take 1,000 Units by mouth daily. [provider] Taking Active   cyanocobalamin 100 MCG tablet [id number] No Take 100 mcg by mouth daily. [provider] Taking Active   estradiol  (ESTRACE ) 0.1 MG/GM vaginal cream [id number] No Place 0.5 g vaginally 2 (two) times a week. Place 0.5g nightly for two weeks then twice a week after  Patient taking differently: Place 0.5 g vaginally 2 (two) times a week.   Zuleta, Kaitlin G, NP Taking Active   fexofenadine (ALLEGRA ODT) 30 MG disintegrating tablet [id number] No Take 30 mg by mouth daily. [provider] Taking Active   multivitamin (THERAGRAN) per tablet [id number] No  Take 1 tablet by mouth daily. [provider] Taking Active   polyethylene glycol (MIRALAX / GLYCOLAX) packet [id number] No Take 17 g by mouth daily as needed. [provider] Taking Active             Recommendation:   Ms. Kaplin reports she engages with [facility] for water fitness and social engagement. Ms. Crapps reports she is prepared for spouse snf discharge and feeling a little anxious about spouse health. Ms. Hirschy denies mental health support at this time and  reports she will advise pcp of future desire to engage in caregiver support group or therapy. Ms. Shibley reports she rather wait til spouse returns home before she engages in additional activities. LCSW A Diksha Tagliaferro offered to call pt for follow up  Ms. Derringer declined further VBCI involvement at this time.   Follow Up Plan:   Closing From:  Complex Care Management  Fletcher Humble MSW, LCSW Licensed Clinical Social Worker  [facility], Population Health Direct Dial: [phone]  Fax: [phone]

## 2024-02-07 NOTE — Patient Instructions (Signed)
 Visit Information  Thank you for taking time to visit with me today. Please don't hesitate to contact me if I can be of assistance to you before our next scheduled appointment.  Your next care management appointment is no further scheduled appointments.    Closing From: Complex Care Management.  Please call the care guide team at [phone] if you need to cancel, schedule, or reschedule an appointment.   Please call 911 if you are experiencing a Mental Health or Behavioral Health Crisis or need someone to talk to.  Fletcher Humble MSW, LCSW Licensed Clinical Social Worker  [facility], Population Health Direct Dial: [phone]  Fax: [phone]

## 2024-02-11 ENCOUNTER — Ambulatory Visit: Payer: Self-pay | Admitting: Internal Medicine

## 2024-02-11 DIAGNOSIS — E870 Hyperosmolality and hypernatremia: Secondary | ICD-10-CM

## 2024-02-18 ENCOUNTER — Encounter (HOSPITAL_BASED_OUTPATIENT_CLINIC_OR_DEPARTMENT_OTHER): Payer: Self-pay

## 2024-02-18 ENCOUNTER — Ambulatory Visit (HOSPITAL_BASED_OUTPATIENT_CLINIC_OR_DEPARTMENT_OTHER)
Admission: RE | Admit: 2024-02-18 | Discharge: 2024-02-18 | Disposition: A | Discharge status: Home / Self Care | Source: Ambulatory Visit | Attending: Internal Medicine | Admitting: Internal Medicine

## 2024-02-18 DIAGNOSIS — Z1231 Encounter for screening mammogram for malignant neoplasm of breast: Secondary | ICD-10-CM | POA: Insufficient documentation

## 2024-03-09 ENCOUNTER — Other Ambulatory Visit: Payer: Self-pay

## 2024-03-24 ENCOUNTER — Ambulatory Visit (AMBULATORY_SURGERY_CENTER)

## 2024-03-24 ENCOUNTER — Encounter: Payer: Self-pay | Admitting: Internal Medicine

## 2024-03-24 VITALS — Ht 65.0 in | Wt 217.0 lb

## 2024-03-24 DIAGNOSIS — Z860100 Personal history of colon polyps, unspecified: Secondary | ICD-10-CM

## 2024-03-24 MED ORDER — NA SULFATE-K SULFATE-MG SULF 17.5-3.13-1.6 GM/177ML PO SOLN: 1.0000 | Freq: Once | ORAL | 0 refills | Status: AC

## 2024-03-24 NOTE — Progress Notes (Signed)
 No egg or soy allergy known to patient  No issues known to pt with past sedation with any surgeries or procedures Patient denies ever being told they had issues or difficulty with intubation  No FH of Malignant Hyperthermia Pt is not on diet pills Pt is not on  home 02  Pt is not on blood thinners  Pt denies issues with constipation- 3x a week   No A fib or A flutter Have any cardiac testing pending--NO Pt can ambulate- INDEPENDENTLY Pt denies use of chewing tobacco Discussed diabetic I weight loss medication holds Discussed NSAID holds Checked BMI Pt instructed to use Singlecare.com or GoodRx for a price reduction on prep  Patient's chart reviewed by Norleen Schillings CNRA prior to previsit and patient appropriate for the [facility].  Pre visit completed and red dot placed by patient's name on their procedure day (on provider's schedule).

## 2024-03-25 ENCOUNTER — Encounter: Payer: Self-pay | Admitting: Internal Medicine

## 2024-03-26 ENCOUNTER — Other Ambulatory Visit: Payer: Self-pay

## 2024-03-26 DIAGNOSIS — E870 Hyperosmolality and hypernatremia: Secondary | ICD-10-CM

## 2024-03-26 NOTE — Addendum Note (Signed)
 Addended by: Aneliz Carbary D on: 03/26/2024 08:00 AM   Modules accepted: Orders

## 2024-03-31 ENCOUNTER — Other Ambulatory Visit: Payer: Self-pay | Admitting: Medical Genetics

## 2024-03-31 ENCOUNTER — Other Ambulatory Visit (INDEPENDENT_AMBULATORY_CARE_PROVIDER_SITE_OTHER)

## 2024-03-31 ENCOUNTER — Encounter: Admitting: Internal Medicine

## 2024-03-31 DIAGNOSIS — E870 Hyperosmolality and hypernatremia: Secondary | ICD-10-CM | POA: Diagnosis not present

## 2024-03-31 LAB — BASIC METABOLIC PANEL WITH GFR
BUN: 13 mg/dL (ref 6–23)
CO2: 32 meq/L (ref 19–32)
Calcium: 9.3 mg/dL (ref 8.4–10.5)
Chloride: 105 meq/L (ref 96–112)
Creatinine, Ser: 0.7 mg/dL (ref 0.40–1.20)
GFR: 90.81 mL/min (ref >60.00)
Glucose, Bld: 133 mg/dL — ABNORMAL HIGH (ref 70–99)
Potassium: 4.2 meq/L (ref 3.5–5.1)
Sodium: 144 meq/L (ref 135–145)

## 2024-04-01 LAB — SODIUM, URINE, RANDOM: Sodium, Ur: 23 mmol/L — ABNORMAL LOW (ref 28–272)

## 2024-04-02 ENCOUNTER — Other Ambulatory Visit (HOSPITAL_COMMUNITY)
Admission: RE | Admit: 2024-04-02 | Discharge: 2024-04-02 | Disposition: A | Discharge status: Home / Self Care | Payer: Self-pay | Source: Ambulatory Visit | Attending: Medical Genetics | Admitting: Medical Genetics

## 2024-04-06 ENCOUNTER — Ambulatory Visit: Payer: Self-pay | Admitting: Internal Medicine

## 2024-04-06 NOTE — Progress Notes (Unsigned)
 [facility] History and Physical   Primary Care Physician:  Amon Aloysius BRAVO, MD   Reason for Procedure:  History of colon polyp  Plan:    Colonoscopy     HPI: Paula Leblanc is a 63 y.o. female status post removal of a diminutive adenoma in 2017.  Her father also had colon cancer twice, both times after the age of 79.  First at 6 and then in his 25s.   Past Medical History:  Diagnosis Date   Allergy    Carpal tunnel syndrome, bilateral    Cataract    bil cataracts removed   Hx of adenomatous polyp of colon 09/26/2016   Intrinsic asthma 04/15/2015   Osteoarthritis of both hands    PONV (postoperative nausea and vomiting)    Seasonal allergies     Past Surgical History:  Procedure Laterality Date   BREAST EXCISIONAL BIOPSY Left    CARPAL TUNNEL RELEASE Right 08/19/2018   Procedure: RIGHT CARPAL TUNNEL RELEASE;  Surgeon: Murrell Kuba, MD;  Location: [facility];  Service: Orthopedics;  Laterality: Right;   CARPAL TUNNEL RELEASE Left 12/09/2018   Procedure: CARPAL TUNNEL RELEASE;  Surgeon: Murrell Kuba, MD;  Location: [facility];  Service: Orthopedics;  Laterality: Left;   CATARACT EXTRACTION Bilateral    L 2004, R 2006   CHOLECYSTECTOMY     cyst removed left breast     TOTAL HIP ARTHROPLASTY Left 05/2021   TOTAL HIP ARTHROPLASTY Right 08/2021   TRIGGER FINGER RELEASE Right 08/19/2018   Procedure: RELEASE RIGHT INDEX TRIGGER FINGER/A-1 PULLEY;  Surgeon: Murrell Kuba, MD;  Location: [facility];  Service: Orthopedics;  Laterality: Right;   WISDOM TOOTH EXTRACTION        Current Outpatient Medications  Medication Sig Dispense Refill   albuterol  (VENTOLIN  HFA) 108 (90 Base) MCG/ACT inhaler Inhale 2 puffs into the lungs every 6 (six) hours as needed for wheezing or shortness of breath. 8 g 0   azelastine  (ASTELIN ) 0.1 % nasal spray Place 1-2 sprays into both nostrils 2 (two) times daily as needed for rhinitis or allergies.  Use in each nostril as directed     budesonide -formoterol  (SYMBICORT ) 160-4.5 MCG/ACT inhaler Inhale 2 puffs into the lungs 2 (two) times daily as needed. 10.2 g 5   cholecalciferol (VITAMIN D ) 1000 units tablet Take 1,000 Units by mouth daily.     cyanocobalamin 100 MCG tablet Take 100 mcg by mouth daily.     estradiol  (ESTRACE ) 0.1 MG/GM vaginal cream Place 0.5 g vaginally 2 (two) times a week. Place 0.5g nightly for two weeks then twice a week after (Patient not taking: Reported on 03/24/2024) 42.5 g 11   fexofenadine (ALLEGRA ODT) 30 MG disintegrating tablet Take 30 mg by mouth daily.     lactobacillus acidophilus (BACID) TABS tablet Take 1 tablet by mouth 2 (two) times daily.     multivitamin (THERAGRAN) per tablet Take 1 tablet by mouth daily.     polyethylene glycol (MIRALAX / GLYCOLAX) packet Take 17 g by mouth daily as needed.     No current facility-administered medications for this visit.    Allergies as of 04/07/2024 - Review Complete 03/24/2024  Allergen Reaction Noted   Erythromycin Other (See Comments) 08/05/2009   Sulfonamide derivatives Other (See Comments) 08/05/2009   Tape Rash 10/30/2018    Family History  Problem Relation Age of Onset   Colon cancer Father 23       x2 63's 73  Diabetes Father    Heart attack Father 65   Stroke Father 67   Hyperlipidemia Mother    Osteoarthritis Mother    Melanoma Mother    Breast cancer Neg Hx    Esophageal cancer Neg Hx    Pancreatic cancer Neg Hx    Rectal cancer Neg Hx    Stomach cancer Neg Hx     Social History   Socioeconomic History   Marital status: Married    Spouse name: Not on file   Number of children: 1   Years of education: Not on file   Highest education level: Bachelor's degree (e.g., BA, AB, BS)  Occupational History   Occupation: retired  Tobacco Use   Smoking status: Never   Smokeless tobacco: Never  Vaping Use   Vaping status: Never Used  Substance and Sexual Activity   Alcohol use: No    Drug use: No   Sexual activity: Not Currently    Birth control/protection: Post-menopausal  Other Topics Concern   Not on file  Social History Narrative   Married w/ [name]    Daughter used to live in [place], works now in [employer]    Social Drivers of Health   Financial Resource Strain: Low Risk  (01/27/2024)   Overall Financial Resource Strain (CARDIA)    Difficulty of Paying Living Expenses: Not very hard  Food Insecurity: No Food Insecurity (02/07/2024)   Hunger Vital Sign    Worried About Running Out of Food in the Last Year: Never true    Ran Out of Food in the Last Year: Never true  Transportation Needs: No Transportation Needs (02/07/2024)   PRAPARE - Administrator, Civil Service (Medical): No    Lack of Transportation (Non-Medical): No  Physical Activity: Insufficiently Active (01/27/2024)   Exercise Vital Sign    Days of Exercise per Week: 3 days    Minutes of Exercise per Session: 20 min  Stress: No Stress Concern Present (01/27/2024)   [employer] of Occupational Health - Occupational Stress Questionnaire    Feeling of Stress : Only a little  Social Connections: Moderately Integrated (01/27/2024)   Social Connection and Isolation Panel    Frequency of Communication with Friends and Family: Twice a week    Frequency of Social Gatherings with Friends and Family: Once a week    Attends Religious Services: 1 to 4 times per year    Active Member of [employer] or Organizations: No    Attends Engineer, structural: Not on file    Marital Status: Married  Catering manager Violence: Not on file    Review of Systems: Positive for *** All other review of systems negative except as mentioned in the HPI.  Physical Exam: Vital signs There were no vitals taken for this visit.  General:   Alert,  Well-developed, well-nourished, pleasant and cooperative in NAD Lungs:  Clear throughout to auscultation.   Heart:  Regular rate and rhythm; no murmurs, clicks,  rubs,  or gallops. Abdomen:  Soft, nontender and nondistended. Normal bowel sounds.   Neuro/Psych:  Alert and cooperative. Normal mood and affect. A and O x 3   @Adelaida Reindel  CHARLENA Commander, MD, [facility] [phone] (pager) 04/06/2024 6:36 PM@

## 2024-04-07 ENCOUNTER — Encounter: Payer: Self-pay | Admitting: Internal Medicine

## 2024-04-07 ENCOUNTER — Ambulatory Visit: Admitting: Internal Medicine

## 2024-04-07 VITALS — BP 115/62 | HR 61 | Temp 97.2°F | Resp 10 | Ht 65.0 in | Wt 217.0 lb

## 2024-04-07 DIAGNOSIS — Z800 Family history of malignant neoplasm of digestive organs: Secondary | ICD-10-CM | POA: Diagnosis not present

## 2024-04-07 DIAGNOSIS — K5730 Diverticulosis of large intestine without perforation or abscess without bleeding: Secondary | ICD-10-CM

## 2024-04-07 DIAGNOSIS — K648 Other hemorrhoids: Secondary | ICD-10-CM | POA: Diagnosis not present

## 2024-04-07 DIAGNOSIS — K644 Residual hemorrhoidal skin tags: Secondary | ICD-10-CM

## 2024-04-07 DIAGNOSIS — D122 Benign neoplasm of ascending colon: Secondary | ICD-10-CM | POA: Diagnosis not present

## 2024-04-07 DIAGNOSIS — Z860100 Personal history of colon polyps, unspecified: Secondary | ICD-10-CM

## 2024-04-07 DIAGNOSIS — Z860101 Personal history of adenomatous and serrated colon polyps: Secondary | ICD-10-CM

## 2024-04-07 DIAGNOSIS — Z1211 Encounter for screening for malignant neoplasm of colon: Secondary | ICD-10-CM

## 2024-04-07 DIAGNOSIS — D123 Benign neoplasm of transverse colon: Secondary | ICD-10-CM | POA: Diagnosis not present

## 2024-04-07 MED ORDER — SODIUM CHLORIDE 0.9 % IV SOLN: 500.0000 mL | Freq: Once | INTRAVENOUS | Status: DC

## 2024-04-07 NOTE — Progress Notes (Signed)
 To pacu, VSS. Report to [email or web address].tb

## 2024-04-07 NOTE — Progress Notes (Signed)
 Called to room to assist during endoscopic procedure.  Patient ID and intended procedure confirmed with present staff. Received instructions for my participation in the procedure from the performing physician.

## 2024-04-07 NOTE — Patient Instructions (Addendum)
 Six polyps removed today. I will let you know pathology results and when to have another routine colonoscopy by mail and/or My Chart.  You also have a condition called diverticulosis - common and not usually a problem. Please read the handout provided.  Hemorrhoids also seen.  I appreciate the opportunity to care for you. Lupita CHARLENA Commander, MD, FACG  YOU HAD AN ENDOSCOPIC PROCEDURE TODAY AT THE [facility]:   Refer to the procedure report that was given to you for any specific questions about what was found during the examination.  If the procedure report does not answer your questions, please call your gastroenterologist to clarify.  If you requested that your care partner not be given the details of your procedure findings, then the procedure report has been included in a sealed envelope for you to review at your convenience later.  YOU SHOULD EXPECT: Some feelings of bloating in the abdomen. Passage of more gas than usual.  Walking can help get rid of the air that was put into your GI tract during the procedure and reduce the bloating. If you had a lower endoscopy (such as a colonoscopy or flexible sigmoidoscopy) you may notice spotting of blood in your stool or on the toilet paper. If you underwent a bowel prep for your procedure, you may not have a normal bowel movement for a few days.  Please Note:  You might notice some irritation and congestion in your nose or some drainage.  This is from the oxygen used during your procedure.  There is no need for concern and it should clear up in a day or so.  SYMPTOMS TO REPORT IMMEDIATELY:  Following lower endoscopy (colonoscopy or flexible sigmoidoscopy):  Excessive amounts of blood in the stool  Significant tenderness or worsening of abdominal pains  Swelling of the abdomen that is new, acute  Fever of 100F or higher  For urgent or emergent issues, a gastroenterologist can be reached at any hour by calling [phone]. Do not use  MyChart messaging for urgent concerns.    DIET:  We do recommend a small meal at first, but then you may proceed to your regular diet.  Drink plenty of fluids but you should avoid alcoholic beverages for 24 hours.  ACTIVITY:  You should plan to take it easy for the rest of today and you should NOT DRIVE or use heavy machinery until tomorrow (because of the sedation medicines used during the test).    FOLLOW UP: Our staff will call the number listed on your records the next business day following your procedure.  We will call around 7:15- 8:00 am to check on you and address any questions or concerns that you may have regarding the information given to you following your procedure. If we do not reach you, we will leave a message.     If any biopsies were taken you will be contacted by phone or by letter within the next 1-3 weeks.  Please call us  at [phone] if you have not heard about the biopsies in 3 weeks.    SIGNATURES/CONFIDENTIALITY: You and/or your care partner have signed paperwork which will be entered into your electronic medical record.  These signatures attest to the fact that that the information above on your After Visit Summary has been reviewed and is understood.  Full responsibility of the confidentiality of this discharge information lies with you and/or your care-partner.

## 2024-04-07 NOTE — Progress Notes (Signed)
 Pt's states no medical or surgical changes since previsit or office visit.

## 2024-04-07 NOTE — Op Note (Signed)
 [facility] Patient Name: Paula Leblanc Procedure Date: 04/07/2024 11:45 AM MRN: [id number] Endoscopist: Lupita FORBES Commander , MD, [id number] Age: 63 Referring MD:  Date of Birth: 1960-12-09 Gender: Female Account #: [id number] Procedure:                Colonoscopy Indications:              Surveillance: Personal history of adenomatous                            polyps on last colonoscopy > 5 years ago, Last                            colonoscopy: 2017 Medicines:                Monitored Anesthesia Care Procedure:                Pre-Anesthesia Assessment:                           - Prior to the procedure, a History and Physical                            was performed, and patient medications and                            allergies were reviewed. The patient's tolerance of                            previous anesthesia was also reviewed. The risks                            and benefits of the procedure and the sedation                            options and risks were discussed with the patient.                            All questions were answered, and informed consent                            was obtained. Prior Anticoagulants: The patient has                            taken no anticoagulant or antiplatelet agents. ASA                            Grade Assessment: II - A patient with mild systemic                            disease. After reviewing the risks and benefits,                            the patient was deemed in satisfactory condition to  undergo the procedure.                           After obtaining informed consent, the colonoscope                            was passed under direct vision. Throughout the                            procedure, the patient's blood pressure, pulse, and                            oxygen saturations were monitored continuously. The                            Olympus Scope [id number] was introduced through  the                            anus and advanced to the the cecum, identified by                            appendiceal orifice and ileocecal valve. The                            colonoscopy was somewhat difficult due to                            significant looping. Successful completion of the                            procedure was aided by applying abdominal pressure.                            The patient tolerated the procedure well. The                            quality of the bowel preparation was good. The                            ileocecal valve, appendiceal orifice, and rectum                            were photographed. The bowel preparation used was                            SUPREP via split dose instruction. She did take                            daily MiraLAx before also. Scope In: 11:52:12 AM Scope Out: 12:23:06 PM Scope Withdrawal Time: 0 hours 23 minutes 45 seconds  Total Procedure Duration: 0 hours 30 minutes 54 seconds  Findings:                 Hemorrhoids were found on perianal exam.  Six sessile polyps were found in the transverse                            colon and ascending colon. The polyps were 1 to 8                            mm in size. These polyps were removed with a cold                            snare. Resection and retrieval were complete.                            Verification of patient identification for the                            specimen was done. Estimated blood loss was minimal.                           Multiple diverticula were found in the sigmoid                            colon. There was no evidence of diverticular                            bleeding.                           External and internal hemorrhoids were found.                           The exam was otherwise without abnormality on                            direct and retroflexion views. Complications:            No immediate  complications. Estimated Blood Loss:     Estimated blood loss was minimal. Impression:               - Hemorrhoids found on perianal exam.                           - Six 1 to 8 mm polyps in the transverse colon and                            in the ascending colon, removed with a cold snare.                            Resected and retrieved.                           - Severe diverticulosis in the sigmoid colon. There                            was no evidence of diverticular bleeding.                           -  External and internal hemorrhoids.                           - The examination was otherwise normal on direct                            and retroflexion views.                           - Personal history of colonic polyp diminutive                            adenoma 2017 (elderly father had CRCA dx age 41                            also). Recommendation:           - Patient has a contact number available for                            emergencies. The signs and symptoms of potential                            delayed complications were discussed with the                            patient. Return to normal activities tomorrow.                            Written discharge instructions were provided to the                            patient.                           - Resume previous diet.                           - Continue present medications.                           - Repeat colonoscopy is recommended for                            surveillance. The colonoscopy date will be                            determined after pathology results from today's                            exam become available for review. Lupita FORBES Commander, MD 04/07/2024 12:29:37 PM This report has been signed electronically.

## 2024-04-08 ENCOUNTER — Telehealth: Payer: Self-pay | Admitting: *Deleted

## 2024-04-08 NOTE — Telephone Encounter (Signed)
  Follow up Call-     04/07/2024   10:49 AM  Call back number  Post procedure Call Back phone  # [phone]  Permission to leave phone message Yes     Patient questions:  Do you have a fever, pain , or abdominal swelling? No. Pain Score  0 *  Have you tolerated food without any problems? Yes.  Have you been able to return to your normal activities? Yes.    Do you have any questions about your discharge instructions: Diet   No. Medications  No. Follow up visit  No.  Do you have questions or concerns about your Care? No.  Actions: * If pain score is 4 or above: No action needed, pain <4.

## 2024-04-10 LAB — SURGICAL PATHOLOGY

## 2024-04-12 ENCOUNTER — Ambulatory Visit: Payer: Self-pay | Admitting: Internal Medicine

## 2024-04-12 DIAGNOSIS — Z860101 Personal history of adenomatous and serrated colon polyps: Secondary | ICD-10-CM

## 2024-04-13 ENCOUNTER — Encounter (INDEPENDENT_AMBULATORY_CARE_PROVIDER_SITE_OTHER): Payer: Self-pay

## 2024-04-14 LAB — GENECONNECT MOLECULAR SCREEN: Genetic Analysis Overall Interpretation: NEGATIVE

## 2024-04-29 DIAGNOSIS — H5203 Hypermetropia, bilateral: Secondary | ICD-10-CM | POA: Diagnosis not present

## 2024-04-29 DIAGNOSIS — E119 Type 2 diabetes mellitus without complications: Secondary | ICD-10-CM | POA: Diagnosis not present

## 2024-04-29 DIAGNOSIS — H52203 Unspecified astigmatism, bilateral: Secondary | ICD-10-CM | POA: Diagnosis not present

## 2024-04-29 DIAGNOSIS — H524 Presbyopia: Secondary | ICD-10-CM | POA: Diagnosis not present

## 2024-04-29 LAB — HM DIABETES EYE EXAM

## 2024-05-19 ENCOUNTER — Encounter: Payer: Self-pay | Admitting: Internal Medicine

## 2024-05-19 ENCOUNTER — Ambulatory Visit (INDEPENDENT_AMBULATORY_CARE_PROVIDER_SITE_OTHER): Admitting: Internal Medicine

## 2024-05-19 VITALS — BP 126/80 | HR 69 | Temp 98.1°F | Resp 16 | Ht 65.0 in | Wt 218.5 lb

## 2024-05-19 DIAGNOSIS — Z636 Dependent relative needing care at home: Secondary | ICD-10-CM | POA: Diagnosis not present

## 2024-05-19 DIAGNOSIS — E1165 Type 2 diabetes mellitus with hyperglycemia: Secondary | ICD-10-CM

## 2024-05-19 DIAGNOSIS — Z7185 Encounter for immunization safety counseling: Secondary | ICD-10-CM

## 2024-05-19 DIAGNOSIS — R202 Paresthesia of skin: Secondary | ICD-10-CM

## 2024-05-19 LAB — HEMOGLOBIN A1C: Hgb A1c MFr Bld: 6.4 % (ref 4.6–6.5)

## 2024-05-19 LAB — B12 AND FOLATE PANEL
Folate: >23.4 ng/mL (ref >5.9)
Vitamin B-12: >1500 pg/mL — ABNORMAL HIGH (ref 211–911)

## 2024-05-19 NOTE — Progress Notes (Unsigned)
 Subjective:    Patient ID: Paula Leblanc, female    DOB: November 18, 1960, 63 y.o.   MRN: [id number]  DOS:  05/19/2024 Type of visit - description: Follow-up  Today we talk about diabetes. Also reports left foot tingling for over a year. Tingling is distal, most noticeable at the base of the great toe. Denies back pain.  Also discusses stress, mostly related to her husband's health.  Review of Systems See above   Past Medical History:  Diagnosis Date   Allergy    Carpal tunnel syndrome, bilateral    Cataract    bil cataracts removed   Hx of adenomatous polyp of colon 09/26/2016   Intrinsic asthma 04/15/2015   Osteoarthritis of both hands    PONV (postoperative nausea and vomiting)    Seasonal allergies     Past Surgical History:  Procedure Laterality Date   BREAST EXCISIONAL BIOPSY Left    CARPAL TUNNEL RELEASE Right 08/19/2018   Procedure: RIGHT CARPAL TUNNEL RELEASE;  Surgeon: Murrell Kuba, MD;  Location: [facility];  Service: Orthopedics;  Laterality: Right;   CARPAL TUNNEL RELEASE Left 12/09/2018   Procedure: CARPAL TUNNEL RELEASE;  Surgeon: Murrell Kuba, MD;  Location: [facility];  Service: Orthopedics;  Laterality: Left;   CATARACT EXTRACTION Bilateral    L 2004, R 2006   CHOLECYSTECTOMY     cyst removed left breast     TOTAL HIP ARTHROPLASTY Left 05/2021   TOTAL HIP ARTHROPLASTY Right 08/2021   TRIGGER FINGER RELEASE Right 08/19/2018   Procedure: RELEASE RIGHT INDEX TRIGGER FINGER/A-1 PULLEY;  Surgeon: Murrell Kuba, MD;  Location: [facility];  Service: Orthopedics;  Laterality: Right;   WISDOM TOOTH EXTRACTION      Current Outpatient Medications  Medication Instructions   albuterol  (VENTOLIN  HFA) 108 (90 Base) MCG/ACT inhaler 2 puffs, Inhalation, Every 6 hours PRN   azelastine  (ASTELIN ) 0.1 % nasal spray 1-2 sprays, 2 times daily PRN   budesonide -formoterol  (SYMBICORT ) 160-4.5 MCG/ACT inhaler 2 puffs, Inhalation, 2 times  daily PRN   cholecalciferol (VITAMIN D ) 1,000 Units, Daily   cyanocobalamin 100 mcg, Daily   multivitamin (THERAGRAN) per tablet 1 tablet, Daily   polyethylene glycol (MIRALAX / GLYCOLAX) 17 g, Daily PRN       Objective:   Physical Exam BP 126/80   Pulse 69   Temp 98.1 F (36.7 C) (Oral)   Resp 16   Ht 5' 5 (1.651 m)   Wt 218 lb 8 oz (99.1 kg)   SpO2 98%   BMI 36.36 kg/m  General:   Well developed, NAD, BMI noted. HEENT:  Normocephalic . Face symmetric, atraumatic Lungs:  CTA B Normal respiratory effort, no intercostal retractions, no accessory muscle use. Heart: RRR,  no murmur.  DM foot exam: All toes well-perfused and warm. Pinprick examination: Essentially normal Skin: Not pale. Not jaundice Neurologic:  alert & oriented X3.  Speech normal, gait appropriate for age and unassisted Psych--  Cognition and judgment appear intact.  Cooperative with normal attention span and concentration.  Behavior appropriate. No anxious or depressed appearing.      Assessment     Assessment  Hyperglycemia Asthma MSK -CTS B -DJD  -hands, saw ortho before, had XRs -spine stenosis, Dx per Dr Bonner via MRI 11-2016 ( Mild spinal stenosis L4-5. Grade 1 anterior slip with severe facet degeneratio) Menopause -- onset ~ 63 y/o FH CAD F age 63 Coronary calcium score 11-2022: 0  PLAN DM: Last A1c 6.6,  she has diabetes, patient aware, is trying to do better with exercise, going to the [facility]. We talk about diet and the need to decrease carbohydrates. Will recheck A1c, if A1c come back around 6.6, she still could be a good candidate for metformin to prevent progression of DM.  She will consider that.   Paresthesia, left foot: New issue, as described above, exam is benign, history of spinal stenosis few years ago but denies low back pain.  Will check vitamins otherwise observation. High cholesterol: Last LDL 124, not on statins, coronary calcium score February 2024 0.  Reassess the issue  periodically. Stress: Related to her husband's health, listening therapy provided. Vaccine advice provided RTC 4 months

## 2024-05-19 NOTE — Patient Instructions (Signed)
 Vaccines I recommend: Flu shot COVID booster  Continue going to the [employer] your diet closely, try to avoid excessive carbohydrates and starches.    GO TO THE LAB :  Get the blood work   Your results will be posted on MyChart with my comments  Go to the front desk for the checkout Please make an appointment for a checkup in 4 months

## 2024-05-20 NOTE — Assessment & Plan Note (Signed)
 DM: Last A1c 6.6, she has diabetes, patient aware, is trying to do better with exercise, going to the [facility]. We talk about diet and the need to decrease carbohydrates. Will recheck A1c, if A1c come back around 6.6, she still could be a good candidate for metformin to prevent progression of DM.  She will consider that.   Paresthesia, left foot: New issue, as described above, exam is benign, history of spinal stenosis few years ago but denies low back pain.  Will check vitamins otherwise observation. High cholesterol: Last LDL 124, not on statins, coronary calcium score February 2024 0.  Reassess the issue periodically. Stress: Related to her husband's health, listening therapy provided. Vaccine advice provided RTC 4 months

## 2024-05-21 ENCOUNTER — Ambulatory Visit: Payer: Self-pay | Admitting: Internal Medicine

## 2024-05-26 ENCOUNTER — Ambulatory Visit: Admitting: Internal Medicine

## 2024-06-02 ENCOUNTER — Ambulatory Visit (INDEPENDENT_AMBULATORY_CARE_PROVIDER_SITE_OTHER): Admitting: Family

## 2024-06-02 ENCOUNTER — Other Ambulatory Visit (HOSPITAL_BASED_OUTPATIENT_CLINIC_OR_DEPARTMENT_OTHER): Payer: Self-pay

## 2024-06-02 VITALS — BP 124/55 | HR 73 | Temp 99.4°F | Resp 16 | Ht 65.0 in | Wt 217.0 lb

## 2024-06-02 DIAGNOSIS — J019 Acute sinusitis, unspecified: Secondary | ICD-10-CM | POA: Insufficient documentation

## 2024-06-02 LAB — POC COVID19 BINAXNOW: SARS Coronavirus 2 Ag: NEGATIVE

## 2024-06-02 MED ORDER — AMOXICILLIN-POT CLAVULANATE 875-125 MG PO TABS
1.0000 | ORAL_TABLET | Freq: Two times a day (BID) | ORAL | 0 refills | Status: DC
  Filled 2024-06-02: qty 14, 7d supply, fill #0

## 2024-06-02 MED ORDER — HYDROCOD POLI-CHLORPHE POLI ER 10-8 MG/5ML PO SUER
5.0000 mL | Freq: Two times a day (BID) | ORAL | 0 refills | Status: DC | PRN
Start: 2024-06-02 — End: 2024-07-07
  Filled 2024-06-02 – 2024-06-03 (×3): qty 50, 5d supply, fill #0

## 2024-06-02 MED ORDER — PSEUDOEPHEDRINE-CODEINE-GG 30-10-100 MG/5ML PO SOLN
10.0000 mL | Freq: Four times a day (QID) | ORAL | 0 refills | Status: DC | PRN
  Filled 2024-06-02: qty 200, 5d supply, fill #0

## 2024-06-02 NOTE — Patient Instructions (Signed)
 VISIT SUMMARY:  You came in today with respiratory symptoms that started after gardening on a high pollen count day. You have a severe cough, nasal and ear congestion, a severe headache, slight fever, and significant fatigue. We discussed your symptoms and provided a treatment plan to help you feel better.  YOUR PLAN:  ACUTE UPPER RESPIRATORY INFECTION: You have a severe cough and congestion that hasn't improved with regular medication. -We prescribed tussionex cough syrup.  SUSPECTED ACUTE BACTERIAL SINUSITIS: Your symptoms may indicate a bacterial sinus infection. -We prescribed Augmentin , an antibiotic, which you should start if you don't see any improvement in 24 hours. -We performed a COVID-19 swab test today which was negative.

## 2024-06-02 NOTE — Progress Notes (Signed)
 Subjective:     Patient ID: Paula Leblanc, female    DOB: 05-05-1961, 63 y.o.   MRN: [id number]  Chief Complaint  Patient presents with   Sore Throat    Patient complains of sore throat x 6 days   Cough    Patient complains of cough that started over the weekend    Fever    Temperature 100.2 this morning    Headache    HPI  Discussed the use of AI scribe software for clinical note transcription with the patient, who gave verbal consent to proceed.  History of Present Illness  Paula Leblanc is a 63 year old female who presents with respiratory symptoms following exposure to high pollen count.  Symptoms began last Wednesday after gardening without a mask on a high pollen count day. Initially, she experienced itchy eyes, ear discomfort, and a severe headache. By the weekend, she developed a worsening cough with phlegm and nasal discharge. She has not been in contact with anyone suspected of having COVID-19.  Today, she has a severe cough, nasal and ear congestion, a severe headache, slight fever, and significant fatigue. Regular cough medicine is ineffective, and she typically requires codeine -based medication for relief.  She is the primary caregiver for her husband, which adds stress as she feels she cannot afford to be ill. She has severe sinusitis, which previously caused her to miss a week of work.     Health Maintenance Due  Topic Date Due   Medicare Annual Wellness (AWV)  Never done   Zoster Vaccines- Shingrix (1 of 2) Never done   COVID-19 Vaccine (7 - 2025-26 season) 06/01/2024    Past Medical History:  Diagnosis Date   Allergy    Carpal tunnel syndrome, bilateral    Cataract    bil cataracts removed   Hx of adenomatous polyp of colon 09/26/2016   Intrinsic asthma 04/15/2015   Osteoarthritis of both hands    PONV (postoperative nausea and vomiting)    Seasonal allergies     Past Surgical History:  Procedure Laterality Date   BREAST EXCISIONAL  BIOPSY Left    CARPAL TUNNEL RELEASE Right 08/19/2018   Procedure: RIGHT CARPAL TUNNEL RELEASE;  Surgeon: Murrell Kuba, MD;  Location: [facility];  Service: Orthopedics;  Laterality: Right;   CARPAL TUNNEL RELEASE Left 12/09/2018   Procedure: CARPAL TUNNEL RELEASE;  Surgeon: Murrell Kuba, MD;  Location: [facility];  Service: Orthopedics;  Laterality: Left;   CATARACT EXTRACTION Bilateral    L 2004, R 2006   CHOLECYSTECTOMY     cyst removed left breast     TOTAL HIP ARTHROPLASTY Left 05/2021   TOTAL HIP ARTHROPLASTY Right 08/2021   TRIGGER FINGER RELEASE Right 08/19/2018   Procedure: RELEASE RIGHT INDEX TRIGGER FINGER/A-1 PULLEY;  Surgeon: Murrell Kuba, MD;  Location: [facility];  Service: Orthopedics;  Laterality: Right;   WISDOM TOOTH EXTRACTION      Family History  Problem Relation Age of Onset   Colon cancer Father 47       x2 70's 62   Diabetes Father    Heart attack Father 37   Stroke Father 60   Hyperlipidemia Mother    Osteoarthritis Mother    Melanoma Mother    Breast cancer Neg Hx    Esophageal cancer Neg Hx    Pancreatic cancer Neg Hx    Rectal cancer Neg Hx    Stomach cancer Neg Hx  Social History   Socioeconomic History   Marital status: Married    Spouse name: Not on file   Number of children: 1   Years of education: Not on file   Highest education level: Bachelor's degree (e.g., BA, AB, BS)  Occupational History   Occupation: retired  Tobacco Use   Smoking status: Never   Smokeless tobacco: Never  Vaping Use   Vaping status: Never Used  Substance and Sexual Activity   Alcohol use: No   Drug use: No   Sexual activity: Not Currently    Birth control/protection: Post-menopausal  Other Topics Concern   Not on file  Social History Narrative   Married w/ Myasia Sinatra    Daughter used to live in [place], works now in [employer]    Social Drivers of Health   Financial Resource Strain: Low Risk  (05/18/2024)    Overall Financial Resource Strain (CARDIA)    Difficulty of Paying Living Expenses: Not hard at all  Food Insecurity: No Food Insecurity (05/18/2024)   Hunger Vital Sign    Worried About Running Out of Food in the Last Year: Never true    Ran Out of Food in the Last Year: Never true  Transportation Needs: No Transportation Needs (05/18/2024)   PRAPARE - Administrator, Civil Service (Medical): No    Lack of Transportation (Non-Medical): No  Physical Activity: Insufficiently Active (05/18/2024)   Exercise Vital Sign    Days of Exercise per Week: 3 days    Minutes of Exercise per Session: 30 min  Stress: No Stress Concern Present (05/18/2024)   [employer] of Occupational Health - Occupational Stress Questionnaire    Feeling of Stress: Only a little  Social Connections: Socially Integrated (05/18/2024)   Social Connection and Isolation Panel    Frequency of Communication with Friends and Family: Twice a week    Frequency of Social Gatherings with Friends and Family: Once a week    Attends Religious Services: 1 to 4 times per year    Active Member of [employer] or Organizations: Yes    Attends Engineer, structural: More than 4 times per year    Marital Status: Married  Catering manager Violence: Not on file    Outpatient Medications Prior to Visit  Medication Sig Dispense Refill   albuterol  (VENTOLIN  HFA) 108 (90 Base) MCG/ACT inhaler Inhale 2 puffs into the lungs every 6 (six) hours as needed for wheezing or shortness of breath. 8 g 0   azelastine  (ASTELIN ) 0.1 % nasal spray Place 1-2 sprays into both nostrils 2 (two) times daily as needed for rhinitis or allergies. Use in each nostril as directed     budesonide -formoterol  (SYMBICORT ) 160-4.5 MCG/ACT inhaler Inhale 2 puffs into the lungs 2 (two) times daily as needed. 10.2 g 5   cholecalciferol (VITAMIN D ) 1000 units tablet Take 1,000 Units by mouth daily.     cyanocobalamin  100 MCG tablet Take 100 mcg by mouth daily.      multivitamin (THERAGRAN) per tablet Take 1 tablet by mouth daily.     polyethylene glycol (MIRALAX / GLYCOLAX) packet Take 17 g by mouth daily as needed.     No facility-administered medications prior to visit.    Allergies  Allergen Reactions   Erythromycin Other (See Comments)    REACTION: Confusion   Sulfonamide Derivatives Other (See Comments)    Pt reported sisters x 4 have had reaction to SULFA, so pt's DR. in [place]  said to avoid taking  SULFA.   Tape Rash    ROS    See  HPI  Objective:    Physical Exam HENT:     Right Ear: Tympanic membrane and ear canal normal.     Left Ear: Tympanic membrane and ear canal normal.     Nose:     Right Sinus: Maxillary sinus tenderness and frontal sinus tenderness present.     Left Sinus: Maxillary sinus tenderness and frontal sinus tenderness present.     Mouth/Throat:     Mouth: Mucous membranes are moist.     Pharynx: No oropharyngeal exudate or posterior oropharyngeal erythema.  Neck:     Thyroid : No thyroid  mass.  Cardiovascular:     Rate and Rhythm: Normal rate and regular rhythm.  Pulmonary:     Breath sounds: Normal breath sounds. No decreased breath sounds or wheezing.  Lymphadenopathy:     Cervical: No cervical adenopathy.     Right cervical: No superficial cervical adenopathy.     BP (!) 124/55 (BP Location: Right Arm, Patient Position: Sitting, Cuff Size: Large)   Pulse 73   Temp 99.4 F (37.4 C) (Oral)   Resp 16   Ht 5' 5 (1.651 m)   Wt 217 lb (98.4 kg)   SpO2 97%   BMI 36.11 kg/m  Wt Readings from Last 3 Encounters:  06/02/24 217 lb (98.4 kg)  05/19/24 218 lb 8 oz (99.1 kg)  04/07/24 217 lb (98.4 kg)       Assessment & Plan:   Problem List Items Addressed This Visit       Unprioritized   Acute sinusitis - Primary   Acute viral respiratory infection.  Covid negative today.  Will treat cough with Tussionex (cheratussin was not covered by her insurance).    Clinically it sounds like she is  progressing into an acute bacterial sinusitis. Recommended that she wait 24 more hours. If sinus symptoms are not improved, recommend that she begin augmentin .         Relevant Medications   amoxicillin -clavulanate (AUGMENTIN ) 875-125 MG tablet   chlorpheniramine-HYDROcodone  (TUSSIONEX) 10-8 MG/5ML   Other Relevant Orders   POC COVID-19 (Completed)    I have discontinued Paula A. Tolle Kathy's pseudoephedrine -codeine -guaifenesin. I am also having her start on amoxicillin -clavulanate and chlorpheniramine-HYDROcodone . Additionally, I am having her maintain her multivitamin, cyanocobalamin , polyethylene glycol, cholecalciferol, azelastine , budesonide -formoterol , and albuterol .  Meds ordered this encounter  Medications   DISCONTD: pseudoephedrine -codeine -guaifenesin (MYTUSSIN DAC) 30-10-100 MG/5ML solution    Sig: Take 10 mLs by mouth 4 (four) times daily as needed for cough.    Dispense:  200 mL    Refill:  0    Supervising Provider:   DOMENICA BLACKBIRD A [[id number]]   amoxicillin -clavulanate (AUGMENTIN ) 875-125 MG tablet    Sig: Take 1 tablet by mouth 2 (two) times daily.    Dispense:  14 tablet    Refill:  0    Supervising Provider:   DOMENICA BLACKBIRD A [[id number]]   chlorpheniramine-HYDROcodone  (TUSSIONEX) 10-8 MG/5ML    Sig: Take 5 mLs by mouth every 12 (twelve) hours as needed for cough.    Dispense:  50 mL    Refill:  0    Supervising Provider:   DOMENICA BLACKBIRD A [[id number]]

## 2024-06-02 NOTE — Assessment & Plan Note (Signed)
 Acute viral respiratory infection.  Covid negative today.  Will treat cough with Tussionex (cheratussin was not covered by her insurance).    Clinically it sounds like she is progressing into an acute bacterial sinusitis. Recommended that she wait 24 more hours. If sinus symptoms are not improved, recommend that she begin augmentin .

## 2024-06-03 ENCOUNTER — Other Ambulatory Visit (HOSPITAL_BASED_OUTPATIENT_CLINIC_OR_DEPARTMENT_OTHER): Payer: Self-pay

## 2024-07-06 ENCOUNTER — Ambulatory Visit: Payer: Self-pay

## 2024-07-06 NOTE — Telephone Encounter (Signed)
 Appt scheduled

## 2024-07-06 NOTE — Telephone Encounter (Signed)
 FYI Only or Action Required?: FYI only for provider.  Patient was last seen in primary care on 06/02/2024 by Daryl Setter, NP.  Called Nurse Triage reporting Eye Problem.  Symptoms began yesterday.  Interventions attempted: OTC medications: Eye drops.  Symptoms are: gradually worsening.  Triage Disposition: See Physician Within 24 Hours  Patient/caregiver understands and will follow disposition?: Yes      Copied from CRM [id number]. Topic: Clinical - Red Word Triage >> Jul 06, 2024  8:19 AM Paula Leblanc wrote: Red Word that prompted transfer to Nurse Triage: Patient states that she thinks she has an eye infection. She woke up yesterday and today with redness, watery eyes, and itching. No swelling noted.  Patient has been using eyedrops but not helping Reason for Disposition  Eye pain present > 24 hours    Patient states itching has been ongoing for over 24 hours  Answer Assessment - Initial Assessment Questions Using eye drops without relief.   1. LOCATION: Where is the redness? (e.g., eyeball or outer eyelids) Note: When callers say the eye is red, they usually mean the sclera (white of the eye) is red.       Red watery in sclera area.  2. ONE OR BOTH EYES: Is the redness in one or both eyes?      L eye worse but in both eyes  3. ONSET: When did the redness start? (e.g., hours, days)      2 days ago  4. EYELIDS: Are the eyelids red or swollen? If Yes, ask: How much?      Denies  5. VISION: Do you have blurred vision?     Denies  6. ITCHING: Does it feel itchy? If so ask: How bad is it (Scale 1-10; or mild, moderate, severe)     Moderate to severe  7. PAIN: Is it painful? If Yes, ask: How bad is the pain? (Scale 0-10; or none, mild, moderate, severe)     Denies  8. CONTACT LENS: Do you wear contacts?     Denies but wears glasses  9. CAUSE: What do you think is causing the redness?     Unsure  10. OTHER SYMPTOMS: Do you have any other  symptoms? (e.g., fever, runny nose, cough, vomiting)       Has some eye discharge  Protocols used: Eye - Redness-A-AH

## 2024-07-07 ENCOUNTER — Telehealth: Payer: Self-pay | Admitting: Internal Medicine

## 2024-07-07 ENCOUNTER — Ambulatory Visit: Admitting: Family Medicine

## 2024-07-07 ENCOUNTER — Encounter: Payer: Self-pay | Admitting: Family Medicine

## 2024-07-07 VITALS — BP 109/61 | HR 70 | Ht 65.0 in | Wt 213.0 lb

## 2024-07-07 DIAGNOSIS — J309 Allergic rhinitis, unspecified: Secondary | ICD-10-CM | POA: Diagnosis not present

## 2024-07-07 DIAGNOSIS — H1013 Acute atopic conjunctivitis, bilateral: Secondary | ICD-10-CM

## 2024-07-07 MED ORDER — AZELASTINE HCL 0.1 % NA SOLN: 1.0000 | Freq: Two times a day (BID) | NASAL | 3 refills | Status: AC | PRN

## 2024-07-07 MED ORDER — CETIRIZINE HCL 10 MG PO TABS: 10.0000 mg | ORAL_TABLET | Freq: Every day | ORAL | 11 refills | Status: AC

## 2024-07-07 NOTE — Progress Notes (Signed)
 Acute Office Visit  Subjective:     Patient ID: Paula Leblanc, female    DOB: Oct 30, 1960, 63 y.o.   MRN: [id number]  Chief Complaint  Patient presents with   Eye Problem     Patient is in today for eye itching.   Discussed the use of AI scribe software for clinical note transcription with the patient, who gave verbal consent to proceed.  History of Present Illness Paula Leblanc is a 63 year old female with a history of sinus infections and allergies who presents with eye irritation and allergy symptoms.  She has been experiencing eye irritation since the beginning of September, with symptoms worsening over the past week. The irritation is more pronounced in the left eye compared to the right. Symptoms are described as gritty, dry, and itchy, with intermittent clear drainage. No blurred vision, double vision, or loss of vision. However, she experiences occasional photophobia and discomfort, particularly after exposure to sunlight, with the pain reported as ranging from 6 to 10 out of 10. States she had a good report from her eye doctor a few months ago.   She has been using olopatadine eye drops with minor relief and rates the itching as 10 out of 10 at times. She has a history of sinus infections, with the most recent occurring in September. She reports frequent sneezing and a runny nose, and sometimes experiences an itchy throat. She has not been using Astelin  nose spray but has been using Flonase , two sprays in each nostril daily.  She has a history of allergies, with fall allergies being worse than spring allergies. She typically experiences sinus infections in the spring as well. She has previously used oral antihistamines such as Claritin, Zyrtec, and Allegra but stopped due to concerns about age-related side effects.        ROS All review of systems negative except what is listed in the HPI      Objective:    BP 109/61   Pulse 70   Ht 5' 5 (1.651 m)   Wt  213 lb (96.6 kg)   SpO2 97%   BMI 35.45 kg/m    Physical Exam Vitals reviewed.  Constitutional:      Appearance: Normal appearance.  Eyes:     Extraocular Movements: Extraocular movements intact.     Conjunctiva/sclera: Conjunctivae normal.     Pupils: Pupils are equal, round, and reactive to light.     Comments: Mild clear drainage bilaterally, minimal redness/conjunctivitis   Neurological:     Mental Status: She is alert and oriented to person, place, and time.  Psychiatric:        Mood and Affect: Mood normal.        Behavior: Behavior normal.        Thought Content: Thought content normal.        Judgment: Judgment normal.     No results found for any visits on 07/07/24.      Assessment & Plan:   Problem List Items Addressed This Visit   None Visit Diagnoses       Allergic conjunctivitis of both eyes    -  Primary   Relevant Medications   azelastine  (ASTELIN ) 0.1 % nasal spray   cetirizine (ZYRTEC) 10 MG tablet     Allergic rhinitis, unspecified seasonality, unspecified trigger       Relevant Medications   azelastine  (ASTELIN ) 0.1 % nasal spray   cetirizine (ZYRTEC) 10 MG tablet  Assessment & Plan Allergic conjunctivitis, left greater than right Intermittent eye irritation with symptoms of grittiness, dryness, itching, watering. Consistent with allergic conjunctivitis, likely due to environmental changes. - Continue allergy eye drops twice daily. - Use artificial tears as needed. - Apply cold compresses for discomfort. - Consult eye doctor if pain or vision changes. Persistent sneezing, runny nose, and congestion, worsened by fall season and environmental changes. Consistent with allergic rhinitis. - Continue Flonase  nasal spray, two sprays each nostril daily. - Add Astelin  nasal spray for antihistamine effect. - Consider Zyrtec for two weeks, monitor for side effects. - Use saline nasal spray to clear nasal passages. - Seek medical attention for  signs of sinus infection.    Meds ordered this encounter  Medications   azelastine  (ASTELIN ) 0.1 % nasal spray    Sig: Place 1-2 sprays into both nostrils 2 (two) times daily as needed for rhinitis or allergies. Use in each nostril as directed    Dispense:  30 mL    Refill:  3    Supervising Provider:   DOMENICA BLACKBIRD A [[id number]]   cetirizine (ZYRTEC) 10 MG tablet    Sig: Take 1 tablet (10 mg total) by mouth daily.    Dispense:  30 tablet    Refill:  11    Supervising Provider:   DOMENICA BLACKBIRD A [[id number]]    Return if symptoms worsen or fail to improve.  Paula Leblanc Mon, NP

## 2024-07-07 NOTE — Telephone Encounter (Signed)
 Pts husband is seeing dr. Amon on 08/11/24 at 2:20 and she wanted to get her flu shot done at that time. I advised her we did not have any appts for that time but she said Kaylyn always gives it to her at the time of her husbands appt. Please call her back if you are able to do it at that time

## 2024-07-07 NOTE — Telephone Encounter (Signed)
 Pt added to nurse schedule for 08/11/24.

## 2024-07-23 DIAGNOSIS — M170 Bilateral primary osteoarthritis of knee: Secondary | ICD-10-CM | POA: Diagnosis not present

## 2024-07-28 ENCOUNTER — Ambulatory Visit (INDEPENDENT_AMBULATORY_CARE_PROVIDER_SITE_OTHER)

## 2024-07-28 DIAGNOSIS — Z23 Encounter for immunization: Secondary | ICD-10-CM

## 2024-08-11 ENCOUNTER — Ambulatory Visit

## 2024-09-22 ENCOUNTER — Encounter: Payer: Self-pay | Admitting: Internal Medicine

## 2024-09-22 ENCOUNTER — Ambulatory Visit: Admitting: Internal Medicine

## 2024-10-07 ENCOUNTER — Encounter: Payer: Self-pay | Admitting: Internal Medicine

## 2024-10-12 ENCOUNTER — Encounter: Payer: Self-pay | Admitting: *Deleted

## 2024-11-03 ENCOUNTER — Ambulatory Visit: Admitting: Internal Medicine

## 2024-11-06 ENCOUNTER — Ambulatory Visit

## 2024-11-06 ENCOUNTER — Other Ambulatory Visit: Payer: Self-pay

## 2024-11-06 VITALS — BP 109/61 | Ht 65.0 in | Wt 210.0 lb

## 2024-11-06 DIAGNOSIS — G8929 Other chronic pain: Secondary | ICD-10-CM

## 2024-11-06 DIAGNOSIS — Z0000 Encounter for general adult medical examination without abnormal findings: Secondary | ICD-10-CM

## 2024-11-06 NOTE — Progress Notes (Signed)
 " I connected with  Paula Leblanc on 11/06/24 by a video and audio enabled telemedicine application and verified that I am speaking with the correct person using two identifiers.  Patient Location: Home  Provider Location: Home Office  Persons Participating in Visit: Patient.  I discussed the limitations of evaluation and management by telemedicine. The patient expressed understanding and agreed to proceed.  Vital Signs: Because this visit was a virtual/telehealth visit, some criteria may be missing or patient reported. Any vitals not documented were not able to be obtained and vitals that have been documented are patient reported.  Chief Complaint  Patient presents with   Medicare Wellness     Subjective:   Paula Leblanc is a 64 y.o. female who presents for a Medicare Annual Wellness Visit.  Visit info / Clinical Intake: Medicare Wellness Visit Type:: Initial Annual Wellness Visit Persons participating in visit and providing information:: patient Medicare Wellness Visit Mode:: Video Since this visit was completed virtually, some vitals may be partially provided or unavailable. Missing vitals are due to the limitations of the virtual format.: Documented vitals are patient reported If Telephone or Video please confirm:: I connected with patient using audio/video enable telemedicine. I verified patient identity with two identifiers, discussed telehealth limitations, and patient agreed to proceed. Patient Location:: home Provider Location:: home office Interpreter Needed?: No Pre-visit prep was completed: yes AWV questionnaire completed by patient prior to visit?: yes Date:: 11/05/24 Living arrangements:: (Patient-Rptd) lives with spouse/significant other Patient's Overall Health Status Rating: (Patient-Rptd) good Typical amount of pain: (Patient-Rptd) some Does pain affect daily life?: (Patient-Rptd) no Are you currently prescribed opioids?: no  Dietary Habits and Nutritional  Risks How many meals a day?: (Patient-Rptd) 3 Eats fruit and vegetables daily?: (Patient-Rptd) yes Most meals are obtained by: (Patient-Rptd) preparing own meals; eating out In the last 2 weeks, have you had any of the following?: none Diabetic:: no  Functional Status Activities of Daily Living (to include ambulation/medication): (Patient-Rptd) Independent Ambulation: (Patient-Rptd) Independent Medication Administration: (Patient-Rptd) Independent Home Management (perform basic housework or laundry): (Patient-Rptd) Needs assistance (comment) Manage your own finances?: (Patient-Rptd) yes Primary transportation is: (Patient-Rptd) driving Concerns about vision?: (Patient-Rptd) no *vision screening is required for WTM* Concerns about hearing?: (Patient-Rptd) no  Fall Screening Falls in the past year?: (Patient-Rptd) 0 Number of falls in past year: 0 Was there an injury with Fall?: 0 Fall Risk Category Calculator: 0 Patient Fall Risk Level: Low Fall Risk  Fall Risk Patient at Risk for Falls Due to: No Fall Risks Fall risk Follow up: Falls evaluation completed; Falls prevention discussed  Home and Transportation Safety: All rugs have non-skid backing?: (Patient-Rptd) yes All stairs or steps have railings?: (Patient-Rptd) yes Grab bars in the bathtub or shower?: (Patient-Rptd) yes Have non-skid surface in bathtub or shower?: (Patient-Rptd) yes Good home lighting?: (Patient-Rptd) yes Regular seat belt use?: (Patient-Rptd) yes Hospital stays in the last year:: (Patient-Rptd) no  Cognitive Assessment Difficulty concentrating, remembering, or making decisions? : (Patient-Rptd) no Will 6CIT or Mini Cog be Completed: yes What year is it?: 0 points What month is it?: 0 points Give patient an address phrase to remember (5 components): [address] [place] About what time is it?: 0 points Count backwards from 20 to 1: 0 points Say the months of the year in reverse: 0 points Repeat  the address phrase from earlier: 0 points 6 CIT Score: 0 points  Advance Directives (For Healthcare) Does Patient Have a Medical Advance Directive?: Yes Does patient  want to make changes to medical advance directive?: No - Patient declined Type of Advance Directive: Healthcare Power of [place]; Living will; Out of facility DNR (pink MOST or yellow form) Copy of Healthcare Power of Attorney in Chart?: Yes - validated most recent copy scanned in chart (See row information) Copy of Living Will in Chart?: Yes - validated most recent copy scanned in chart (See row information) Out of facility DNR (pink MOST or yellow form) in Chart? (Ambulatory ONLY): No - copy requested  Reviewed/Updated  Reviewed/Updated: Reviewed All (Medical, Surgical, Family, Medications, Allergies, Care Teams, Patient Goals)    Allergies (verified) Erythromycin, Sulfonamide derivatives, and Tape   Current Medications (verified) Outpatient Encounter Medications as of 11/06/2024  Medication Sig   albuterol  (VENTOLIN  HFA) 108 (90 Base) MCG/ACT inhaler Inhale 2 puffs into the lungs every 6 (six) hours as needed for wheezing or shortness of breath.   azelastine  (ASTELIN ) 0.1 % nasal spray Place 1-2 sprays into both nostrils 2 (two) times daily as needed for rhinitis or allergies. Use in each nostril as directed   budesonide -formoterol  (SYMBICORT ) 160-4.5 MCG/ACT inhaler Inhale 2 puffs into the lungs 2 (two) times daily as needed.   cetirizine  (ZYRTEC ) 10 MG tablet Take 1 tablet (10 mg total) by mouth daily.   cholecalciferol (VITAMIN D ) 1000 units tablet Take 1,000 Units by mouth daily.   cyanocobalamin  100 MCG tablet Take 100 mcg by mouth daily.   multivitamin (THERAGRAN) per tablet Take 1 tablet by mouth daily.   polyethylene glycol (MIRALAX / GLYCOLAX) packet Take 17 g by mouth daily as needed.   No facility-administered encounter medications on file as of 11/06/2024.    History: Past Medical History:  Diagnosis Date    Allergy    Carpal tunnel syndrome, bilateral    Cataract    bil cataracts removed   Hx of adenomatous polyp of colon 09/26/2016   Intrinsic asthma 04/15/2015   Osteoarthritis of both hands    PONV (postoperative nausea and vomiting)    Seasonal allergies    Past Surgical History:  Procedure Laterality Date   BREAST EXCISIONAL BIOPSY Left    CARPAL TUNNEL RELEASE Right 08/19/2018   Procedure: RIGHT CARPAL TUNNEL RELEASE;  Surgeon: Murrell Kuba, MD;  Location: [facility];  Service: Orthopedics;  Laterality: Right;   CARPAL TUNNEL RELEASE Left 12/09/2018   Procedure: CARPAL TUNNEL RELEASE;  Surgeon: Murrell Kuba, MD;  Location: [facility];  Service: Orthopedics;  Laterality: Left;   CATARACT EXTRACTION Bilateral    L 2004, R 2006   CHOLECYSTECTOMY     cyst removed left breast     EYE SURGERY  Cataracts   JOINT REPLACEMENT  Aug 2022 & Nov 2022   TOTAL HIP ARTHROPLASTY Left 05/2021   TOTAL HIP ARTHROPLASTY Right 08/2021   TRIGGER FINGER RELEASE Right 08/19/2018   Procedure: RELEASE RIGHT INDEX TRIGGER FINGER/A-1 PULLEY;  Surgeon: Murrell Kuba, MD;  Location: [facility];  Service: Orthopedics;  Laterality: Right;   WISDOM TOOTH EXTRACTION     Family History  Problem Relation Age of Onset   Colon cancer Father 27       x2 66's 54   Diabetes Father    Heart attack Father 41   Stroke Father 42   Hyperlipidemia Mother    Osteoarthritis Mother    Melanoma Mother    Arthritis Mother    Arthritis Sister    Arthritis Brother    Asthma Sister    Depression Sister  Breast cancer Neg Hx    Esophageal cancer Neg Hx    Pancreatic cancer Neg Hx    Rectal cancer Neg Hx    Stomach cancer Neg Hx    Social History   Occupational History   Occupation: retired  Tobacco Use   Smoking status: Never   Smokeless tobacco: Never  Vaping Use   Vaping status: Never Used  Substance and Sexual Activity   Alcohol use: No   Drug use: No   Sexual  activity: Not Currently    Birth control/protection: Post-menopausal   Tobacco Counseling Counseling given: Not Answered  SDOH Screenings   Food Insecurity: No Food Insecurity (11/06/2024)  Housing: Low Risk (11/06/2024)  Transportation Needs: No Transportation Needs (11/06/2024)  Utilities: Not At Risk (11/06/2024)  Alcohol Screen: Low Risk (01/27/2024)  Depression (PHQ2-9): Low Risk (11/06/2024)  Financial Resource Strain: Low Risk (09/21/2024)  Physical Activity: Sufficiently Active (11/06/2024)  Recent Concern: Physical Activity - Insufficiently Active (09/21/2024)  Social Connections: Moderately Integrated (11/06/2024)  Stress: No Stress Concern Present (11/06/2024)  Tobacco Use: Low Risk (11/06/2024)  Health Literacy: Adequate Health Literacy (11/06/2024)   See flowsheets for full screening details  Depression Screen PHQ 2 & 9 Depression Scale- Over the past 2 weeks, how often have you been bothered by any of the following problems? Little interest or pleasure in doing things: 0 Feeling down, depressed, or hopeless (PHQ Adolescent also includes...irritable): 0 PHQ-2 Total Score: 0 Trouble falling or staying asleep, or sleeping too much: 0 Feeling tired or having little energy: 0 Poor appetite or overeating (PHQ Adolescent also includes...weight loss): 0 Feeling bad about yourself - or that you are a failure or have let yourself or your family down: 0 Trouble concentrating on things, such as reading the newspaper or watching television (PHQ Adolescent also includes...like school work): 0 Moving or speaking so slowly that other people could have noticed. Or the opposite - being so fidgety or restless that you have been moving around a lot more than usual: 0 Thoughts that you would be better off dead, or of hurting yourself in some way: 0 PHQ-9 Total Score: 0 If you checked off any problems, how difficult have these problems made it for you to do your work, take care of things at home, or get  along with other people?: Not difficult at all  Depression Treatment Depression Interventions/Treatment : EYV7-0 Score <4 Follow-up Not Indicated     Goals Addressed               This Visit's Progress     more movement (pt-stated)               Objective:    Today's Vitals   11/06/24 0811  BP: 109/61  Weight: 210 lb (95.3 kg)  Height: 5' 5 (1.651 m)   Body mass index is 34.95 kg/m.  Hearing/Vision screen Hearing Screening - Comments:: No difficulties  Vision Screening - Comments:: Patient wears glasses. [facility] Dr[facility] Huey [facility] Immunizations and Health Maintenance Health Maintenance  Topic Date Due   Zoster Vaccines- Shingrix (1 of 2) Never done   COVID-19 Vaccine (8 - 2025-26 season) 06/01/2024   HEMOGLOBIN A1C  11/19/2024   Diabetic kidney evaluation - Urine ACR  02/05/2025   Diabetic kidney evaluation - eGFR measurement  03/31/2025   OPHTHALMOLOGY EXAM  04/29/2025   FOOT EXAM  05/19/2025   Medicare Annual Wellness (AWV)  11/06/2025   Mammogram  02/17/2026   DTaP/Tdap/Td (5 - Td  or Tdap) 07/20/2026   Colonoscopy  04/08/2027   Cervical Cancer Screening (HPV/Pap Cotest)  01/10/2028   Pneumococcal Vaccine: 50+ Years  Completed   Influenza Vaccine  Completed   Bone Density Scan  Completed   Hepatitis C Screening  Completed   HIV Screening  Completed   Hepatitis B Vaccines 19-59 Average Risk  Aged Out   Meningococcal B Vaccine  Aged Out        Assessment/Plan:  This is a routine wellness examination for Paula Leblanc.  Patient Care Team: Amon Aloysius BRAVO, MD as PCP - General (Internal Medicine) Murrell Kuba, MD as Consulting Physician (Orthopedic Surgery) Bonner Ade, MD as Consulting Physician (Physical Medicine and Rehabilitation) Debby Hila, MD as Consulting Physician (General Surgery) Melodi Lerner, MD as Consulting Physician (Orthopedic Surgery) Mat Browning, MD as Consulting Physician (Obstetrics and Gynecology) Tsamis, Comer Pillow, MD as Referring Physician (Ophthalmology) Kay Kemps, MD as Consulting Physician (Orthopedic Surgery)  I have personally reviewed and noted the following in the patients chart:   Medical and social history Use of alcohol, tobacco or illicit drugs  Current medications and supplements including opioid prescriptions. Functional ability and status Nutritional status Physical activity Advanced directives List of other physicians Hospitalizations, surgeries, and ER visits in previous 12 months  Vitals Screenings to include cognitive, depression, and falls Referrals and appointments  No orders of the defined types were placed in this encounter.  In addition, I have reviewed and discussed with patient certain preventive protocols, quality metrics, and best practice recommendations. A written personalized care plan for preventive services as well as general preventive health recommendations were provided to patient.   Lyle MARLA Right, [place]   11/06/2024   No follow-ups on file.  After Visit Summary: (MyChart) Due to this being a telephonic visit, the after visit summary with patients personalized plan was offered to patient via MyChart   Separate telephone note sent for (referral ) Vaccines not given: covid and shingles  declined today  "

## 2024-11-06 NOTE — Patient Instructions (Signed)
 Ms. Farkas,  Thank you for taking the time for your Medicare Wellness Visit. I appreciate your continued commitment to your health goals. Please review the care plan we discussed, and feel free to reach out if I can assist you further.  Please note that Annual Wellness Visits do not include a physical exam. Some assessments may be limited, especially if the visit was conducted virtually. If needed, we may recommend an in-person follow-up with your provider.  Ongoing Care Seeing your primary care provider every 3 to 6 months helps us  monitor your health and provide consistent, personalized care.   Referrals If a referral was made during today's visit and you haven't received any updates within two weeks, please contact the referred provider directly to check on the status.  Recommended Screenings:  Health Maintenance  Topic Date Due   Medicare Annual Wellness Visit  Never done   Zoster (Shingles) Vaccine (1 of 2) Never done   COVID-19 Vaccine (8 - 2025-26 season) 06/01/2024   Hemoglobin A1C  11/19/2024   Kidney health urinalysis for diabetes  02/05/2025   Yearly kidney function blood test for diabetes  03/31/2025   Eye exam for diabetics  04/29/2025   Complete foot exam   05/19/2025   Breast Cancer Screening  02/17/2026   DTaP/Tdap/Td vaccine (5 - Td or Tdap) 07/20/2026   Colon Cancer Screening  04/08/2027   Pap with HPV screening  01/10/2028   Pneumococcal Vaccine for age over 48  Completed   Flu Shot  Completed   Osteoporosis screening with Bone Density Scan  Completed   Hepatitis C Screening  Completed   HIV Screening  Completed   Hepatitis B Vaccine  Aged Out   Meningitis B Vaccine  Aged Out       11/05/2024    9:38 AM  Advanced Directives  Does Patient Have a Medical Advance Directive? Yes  Type of Estate Agent of [place];Living will;Out of facility DNR (pink MOST or yellow form)  Does patient want to make changes to medical advance directive? No -  Patient declined  Copy of Healthcare Power of Attorney in Chart? Yes - validated most recent copy scanned in chart (See row information)    Vision: Annual vision screenings are recommended for early detection of glaucoma, cataracts, and diabetic retinopathy. These exams can also reveal signs of chronic conditions such as diabetes and high blood pressure.  Dental: Annual dental screenings help detect early signs of oral cancer, gum disease, and other conditions linked to overall health, including heart disease and diabetes.  Please see the attached documents for additional preventive care recommendations.

## 2024-12-04 ENCOUNTER — Ambulatory Visit: Admitting: Internal Medicine

## 2024-12-09 ENCOUNTER — Ambulatory Visit: Admitting: Internal Medicine
